# Patient Record
Sex: Female | Born: 1990 | Race: White | Hispanic: No | Smoking: Current every day smoker
Health system: Southern US, Community
[De-identification: ages and names within clinical notes are randomized; demographics above are authoritative.]

## PROBLEM LIST (undated history)

## (undated) DIAGNOSIS — F419 Anxiety disorder, unspecified: Secondary | ICD-10-CM

## (undated) DIAGNOSIS — F329 Major depressive disorder, single episode, unspecified: Secondary | ICD-10-CM

## (undated) DIAGNOSIS — F32A Depression, unspecified: Secondary | ICD-10-CM

## (undated) DIAGNOSIS — N879 Dysplasia of cervix uteri, unspecified: Secondary | ICD-10-CM

## (undated) DIAGNOSIS — F1910 Other psychoactive substance abuse, uncomplicated: Secondary | ICD-10-CM

## (undated) DIAGNOSIS — Z811 Family history of alcohol abuse and dependence: Secondary | ICD-10-CM

## (undated) DIAGNOSIS — Z8619 Personal history of other infectious and parasitic diseases: Secondary | ICD-10-CM

## (undated) DIAGNOSIS — Z975 Presence of (intrauterine) contraceptive device: Secondary | ICD-10-CM

## (undated) HISTORY — DX: Presence of (intrauterine) contraceptive device: Z97.5

## (undated) HISTORY — DX: Personal history of other infectious and parasitic diseases: Z86.19

## (undated) HISTORY — PX: BACK SURGERY: SHX140

## (undated) HISTORY — DX: Other psychoactive substance abuse, uncomplicated: F19.10

## (undated) HISTORY — DX: Dysplasia of cervix uteri, unspecified: N87.9

## (undated) HISTORY — DX: Family history of alcohol abuse and dependence: Z81.1

---
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
## 2008-02-17 ENCOUNTER — Inpatient Hospital Stay (HOSPITAL_COMMUNITY): Admission: EM | Admit: 2008-02-17 | Discharge: 2008-02-20 | Payer: Self-pay | Admitting: Psychiatry

## 2008-02-17 ENCOUNTER — Emergency Department (HOSPITAL_COMMUNITY): Admission: EM | Admit: 2008-02-17 | Discharge: 2008-02-17 | Payer: Self-pay | Admitting: Emergency Medicine

## 2008-02-18 ENCOUNTER — Ambulatory Visit: Payer: Self-pay | Admitting: Psychiatry

## 2008-08-10 ENCOUNTER — Ambulatory Visit: Payer: Self-pay | Admitting: *Deleted

## 2008-08-10 ENCOUNTER — Emergency Department (HOSPITAL_COMMUNITY): Admission: EM | Admit: 2008-08-10 | Discharge: 2008-08-11 | Payer: Self-pay | Admitting: Emergency Medicine

## 2008-08-11 ENCOUNTER — Inpatient Hospital Stay (HOSPITAL_COMMUNITY): Admission: RE | Admit: 2008-08-11 | Discharge: 2008-08-14 | Payer: Self-pay | Admitting: *Deleted

## 2008-08-15 ENCOUNTER — Emergency Department (HOSPITAL_COMMUNITY): Admission: EM | Admit: 2008-08-15 | Discharge: 2008-08-15 | Payer: Self-pay | Admitting: *Deleted

## 2008-08-16 ENCOUNTER — Inpatient Hospital Stay (HOSPITAL_COMMUNITY): Admission: AD | Admit: 2008-08-16 | Discharge: 2008-08-19 | Payer: Self-pay | Admitting: Psychiatry

## 2008-08-24 ENCOUNTER — Other Ambulatory Visit (HOSPITAL_COMMUNITY): Admission: RE | Admit: 2008-08-24 | Discharge: 2008-11-12 | Payer: Self-pay | Admitting: Psychiatry

## 2008-08-27 ENCOUNTER — Ambulatory Visit: Payer: Self-pay | Admitting: Psychiatry

## 2009-04-01 ENCOUNTER — Emergency Department (HOSPITAL_COMMUNITY): Admission: EM | Admit: 2009-04-01 | Discharge: 2009-04-01 | Payer: Self-pay | Admitting: Emergency Medicine

## 2010-02-05 ENCOUNTER — Emergency Department (HOSPITAL_COMMUNITY): Admission: EM | Admit: 2010-02-05 | Discharge: 2010-02-06 | Payer: Self-pay | Admitting: Emergency Medicine

## 2011-01-01 LAB — POCT I-STAT, CHEM 8
Creatinine, Ser: 0.8 mg/dL (ref 0.4–1.2)
Glucose, Bld: 92 mg/dL (ref 70–99)
HCT: 45 % (ref 36.0–46.0)
Hemoglobin: 15.3 g/dL — ABNORMAL HIGH (ref 12.0–15.0)
Sodium: 138 mEq/L (ref 135–145)

## 2011-01-01 LAB — POCT PREGNANCY, URINE: Preg Test, Ur: NEGATIVE

## 2011-01-01 LAB — GLUCOSE, CAPILLARY: Glucose-Capillary: 100 mg/dL — ABNORMAL HIGH (ref 70–99)

## 2011-02-07 NOTE — Discharge Summary (Signed)
[name], [name]            ACCOUNT NO.:  [id number]   MEDICAL RECORD NO.:  [id number]          PATIENT TYPE:  IPS   LOCATION:  0302                          FACILITY:  [facility]   PHYSICIAN:  Jasmine Pang, M.D. DATE OF BIRTH:  31-Jul-1991   DATE OF ADMISSION:  08/16/2008  DATE OF DISCHARGE:  08/19/2008                               DISCHARGE SUMMARY   IDENTIFICATION:  This is a 20 year old single white female who was  admitted on a voluntary basis on August 16, 2008.   HISTORY OF PRESENT ILLNESS:  The patient had just been discharged from  our unit within the past couple of days.  She states she got into an  argument with her mother into an overdose of her Ativan.  She was sent  to [facility] where she was medically stabilized.  She had  been begging her mother for money to get marijuana.  She states this is  what started the argument.   PSYCHIATRIC HISTORY:  As indicated above, she was diagnosed with bipolar  disorder NOS and started on Depakote ER 500 mg p.o. q.h.s.   ALCOHOL AND DRUG HISTORY:  Smokes THC.  She also smokes cigarettes.   MEDICAL PROBLEMS:  None.   MEDICATIONS:  Depakote ER 500 mg p.o. q.h.s.   PHYSICAL FINDINGS:  Physical exam was done in the [facility] ED.  There  were no acute physical or medical problems noted.   ADMISSION LABORATORIES:  WBC was 12.4.  Alcohol level less than 5.  Urinalysis, 3-6 WBCs.  Salicylate level less than 4.  UDS was positive  for benzodiazepines and THC.   HOSPITAL COURSE:  Upon admission, the patient was started on Depakote ER  500 mg p.o. q.h.s.  She was also started on Seroquel 25 mg p.o. q.4 h.  p.r.n. agitation.  She was also started on Librium 25 mg p.o. q.4 h.  p.r.n. withdrawal.  On August 17, 2008, Depakote ER was increased to  750 mg p.o. q.h.s.  In individual sessions with me, the patient was  somewhat irritable, but cooperative.  She stated I feel like my mom was  ripping me to shreds.  She wants a  family session with her mother.  Sleep has been poor.  She states she has been depressed and anxious.  Her affect was tearful.  She denied suicidal ideation now.  She states  she was trying to get a reaction from her mother.  She states that she  was jealous because her mother's boyfriend came to spend the weekend  with them on the same day that she could discharge.  She felt her mother  should have been the time just with her.  On August 18, 2008, she was  less depressed, less anxious.  Mother visited last night.  The patient  states she wants to stay clean.  She had a family session with her  mother.  The family session did not go well.  She was argumentative with  her mother, however, in the 29.  After the session, she states they were  able to get things settle and become  much more and come to some terms  with each other.  On April 18, 2008, sleep was good.  Appetite was good.  Mood was less depressed, less anxious.  Affect was consistent with mood.  There was no suicidal or homicidal ideation.  No thoughts of self-  injurious behavior.  No auditory or visual hallucinations.  No paranoia  or delusions.  Thoughts were logical and goal directed.  Thought  content, no predominant theme.  Cognitive was grossly intact.  Insight  fair.  Judgment fair.  Impulse control good.  The patient felt ready to  go home today.  Mother wanted her to come home since it was Thanksgiving  the following day.   DISCHARGE DIAGNOSES:  Axis I:  Bipolar disorder, not otherwise  specified.  Marijuana abuse.  Axis II:  Features of borderline personality disorder.  Axis III:  None.  Axis IV:  Severe (conflict with mother, burden of psychiatric illness).  Axis V:  Global assessment of functioning was 50 upon discharge.  GAF  was 35 upon admission.  GAF highest past year was 60.   DISCHARGE PLAN:  The patient will go to the [facility] Intensive  Outpatient CD program on November 30th at 10:30 a.m.   DISCHARGE  MEDICATIONS:  1. Depakote ER 750 mg p.o. q.h.s.  2. Seroquel 25 mg every 4 hours as needed for anxiety.      Jasmine Pang, M.D.  Electronically Signed     BHS/MEDQ  D:  08/20/2008  T:  08/20/2008  Job:  [id number]

## 2011-02-07 NOTE — Discharge Summary (Signed)
[name], [name]            ACCOUNT NO.:  [id number]   MEDICAL RECORD NO.:  [id number]          PATIENT TYPE:  IPS   LOCATION:  0305                          FACILITY:  [facility]   PHYSICIAN:  [name], M.D. DATE OF BIRTH:  Jan 30, 1991   DATE OF ADMISSION:  08/11/2008  DATE OF DISCHARGE:  08/14/2008                               DISCHARGE SUMMARY   IDENTIFYING INFORMATION:  This is a 20 year old single white female who  was admitted on a voluntary basis on August 10, 2008.   HISTORY OF PRESENT ILLNESS:  The patient presented with an intentional  overdose on her mother's alprazolam, cough syrup, 10 Advil, and 15  Effexor.  Boyfriend confirmed she left a voice mail indicating her  intent to kill herself.  She was brought to the ED when the family found  her suicide note.  She endorses irritability and angry mood.  She wished  that she had succeeded accusing up on world.  Still grieves the death  of her boyfriend in Feb 06, 2006.   PAST PSYCHIATRIC HISTORY:  The patient has an outpatient therapy.  She  has a history of alprazolam abuse.  This is the second [facility] admission for  the patient.  Previous admission was 02/07/2008.  She smokes marijuana 2  balls daily to calm down.  She had a history of suicidal gesture prior  to this admission (overdose).   FAMILY HISTORY:  Grandmother completed suicide via gunshot wound.  Father is an alcoholic according to the patient.   ALCOHOL AND DRUG HISTORY:  See the history of present illness.   MEDICAL PROBLEMS:  No acute or chronic medical problems.   MEDICATIONS:  Effexor XR 75 mg daily; she states she does not take it  regularly.  Ativan in the ED.   DRUG ALLERGIES:  SULFA.   PHYSICAL FINDINGS:  There were no acute physical or medical problems  noted.  Her physical exam was done in the ED prior to admission.   ADMISSION LABORATORIES:  UDS was positive for benzodiazepines and THC.  ECG, she was medically cleared.   HOSPITAL COURSE:  Upon  admission, the patient was placed on Librium 25  mg p.o. q.6 hours p.r.n. symptoms of withdrawal and Ambien 5 mg p.o.  q.h.s. p.r.n. insomnia.  On August 12, 2008, the Librium was  discontinued and she was placed on Ativan 1 mg q.6 hours p.r.n.  agitation and anxiety.  In individual sessions with me, the patient was  reserved, but cooperative.  She also participated appropriately in unit  therapeutic groups and activities.  She denies a specific stressor, but  just life.  She states she wanted to die.  She states she stopped  using cocaine after her last hospitalization with [place] in Feb 07, 2008.  She  does admit using marijuana daily and a lot of pills, Xanax, Klonopin,  and pain pills.  She was seeing Mariane Masters, but has not been there  for a while.  She states she feels safe in the hospital scared to go  home.  She was on Effexor, but it activated her.  She also talked  about  positive mood swings alternating between euphoria and depression.  On  August 12, 2008, she was tearful and anxious.  She talked about her  addiction to marijuana.  She felt she needed this to stabilize her mood.  She continued to state she wished she had succeeded I should not be  here.  She was started on Depakote ER 500 mg p.o. daily and she was  also started on Neurontin 100 mg p.o. q.4 hours p.r.n. anxiety.  On  August 13, 2008, she was less depressed, less anxious.  There was no  suicidal ideation.  She talked about having HPV and feels embarrassed  about this.  She states a former boyfriend found out and she had to talk  to him about it, she felt ashamed.  On August 14, 2008, sleep was  good, appetite was good.  Mood was less depressed, less anxious.  Affect  consistent with mood.  There was no suicidal or homicidal ideation.  No  thoughts of self injurious behavior.  No auditory or visual  hallucinations.  No paranoia or delusions.  Thoughts were logical and  goal-directed, thought content.  No  predominant theme.  Cognitive was  grossly intact.  A meeting was held with her mother, discussed the  patient's medications.  Mother was willing to take her home.  The  patient was referred to the [facility] Intensive  Outpatient Program and was seen by Darlyne Russian, director of this  program, who assessed the patient for the referral.   DISCHARGE DIAGNOSES:  Axis I:  Bipolar disorder not otherwise specified.  Axis II:  None.  Axis III:  Human papillomavirus.  AXIS IV:  Moderate (problems with primary support group, burden of  psychiatric illness).  Axis V:  Global assessment of functioning was 50 upon discharge.  GAF  was 25 upon admission.  GAF highest past year was 60-65.   DISCHARGE PLAN:  There was no specific activity level or dietary  restrictions.   POSTHOSPITAL CARE PLANS:  The patient will go to the CD IOP program on  August 17, 2008, at 10:30 a.m.   DISCHARGE MEDICATIONS:  1. Ativan 0.5 mg every 6 hours as needed, she was given just 24.  2. Depakote ER 500 mg p.o. q.6 p.m.      [name], M.D.  Electronically Signed     BHS/MEDQ  D:  09/13/2008  T:  09/13/2008  Job:  [id number]

## 2011-02-07 NOTE — Discharge Summary (Signed)
[name], [name]            ACCOUNT NO.:  [id number]   MEDICAL RECORD NO.:  [id number]          PATIENT TYPE:  IPS   LOCATION:  0307                          FACILITY:  [facility]   PHYSICIAN:  Jasmine Pang, M.D. DATE OF BIRTH:  03/22/1991   DATE OF ADMISSION:  02/17/2008  DATE OF DISCHARGE:  02/20/2008                               DISCHARGE SUMMARY   IDENTIFICATION:  This is an 20 year old single white female who was  admitted on a voluntary basis on Feb 17, 2008.   HISTORY OF PRESENT ILLNESS:  The patient states that she tried to kill  myself by taking pills.  She feels that she would be better off dead.  She states she is addicted to crack cocaine and pills.  She overdosed on  Tylenol taking approximately 30 tablets.  She also overdosed on 30-40  ibuprofen and 7 Vicodin, 3 Xanax and Nyquil tablets.  She reports  feeling very overwhelmed with her work relationship issues.  She called  her sister and told her about the overdose who then called her mother.  The patient was taken to the emergency department per EMS.  She was  feeling very worthless and hopeless.   PAST PSYCHIATRIC HISTORY:  This is the first admission to [facility].  She was  to start seeing Elsie Stain at [facility].   FAMILY HISTORY:  Grandmother committed suicide.  Father had problems  with alcohol.   ALCOHOL AND DRUG HISTORY:  As above.  Denies any IV drug use.  Again,  she reports being addicted to crack cocaine and pain killers.   MEDICAL PROBLEMS:  She denies any acute or chronic health issues.   MEDICATIONS:  None.   DRUG ALLERGIES:  SULFA.   PHYSICAL FINDINGS:  This is a healthy-appearing young female who was  fully assessed at the [facility] emergency department.  Her physical  exam was reviewed.  There were no acute physical or medical problems  noted.  She did receive a GI cocktail Pepcid, Zofran IV push, and  potassium chloride 40 mEq for a potassium of 2.9.   ADMISSION LABS:   Laboratory data showed an acetaminophen level of 69.3  down to 29.9.  Alcohol level was 62.  Salicylate was less than 4.  Urinalysis was negative.  Urine drug screen was positive for opiates and  THC.  CBC was within normal limits.   HOSPITAL COURSE:  Upon admission, the patient was started on Ambien 5 mg  p.o. q.h.s. p.r.n., insomnia.  She was also started on Effexor XR 37.5  mg daily to take in the a.m.  The patient tolerated these medications  well with no significant side effects.  In individual sessions with me,  the patient was lying in bed.  She was reserved, but cooperative.  She  stated she had wanted to kill herself.  She was somewhat resistant  initially to attending unit group therapies and activities, but became  more involved as hospitalization progressed.  Stressors revolve around  her drug use.  She works in school.  She lives with her mother who she  describes is very  supportive.  On Feb 19, 2008, the patient was still  somewhat depressed and irritable.  She discussed her drug using friends  and conflicted feelings about whether to give these people up.  She  discussed the death of her boyfriend from osteosarcoma.  Her mother has  been helped by Effexor and that is the reason this medication was chosen  for the patient.  On Feb 20, 2008, mental status had improved markedly  from admission status.  Mood was less depressed, less anxious.  Affect  was consistent with mood.  There was no suicidal or homicidal ideation.  No thoughts of self-injurious behavior.  No auditory or visual  hallucinations.  No paranoia or delusions.  Thoughts were logical and  goal-directed.  Thought content, no predominant theme.  Cognitive was  grossly intact.  It was felt the patient was stable for discharged  today.  She was going to go home with her mother.  Followup was  scheduled with her therapist Mariane Masters and psychiatrist at [facility].  She is also to go to the [facility]  intensive outpatient  group.   DISCHARGE DIAGNOSES:  Axis I:  Depressive disorder, not otherwise  specified.  Polysubstance abuse.  Axis II:  None.  Axis III:  No known medical problems.  Axis IV:  Moderate (other psychosocial problems, problems with  occupation, and problems with education).  Axis V:  Global assessment of functioning was 50 upon discharge.  GAF  was 35-40 upon admission.  GAF highest past year was 65-70.   DISCHARGE PLANS:  There was no specific activity level or dietary  restriction.   POSTHOSPITAL CARE PLANS:  The patient will see Mariane Masters for  outpatient therapy.  This appointment has already been arranged by her  mother for Wednesday February 26, 2008.  She will also go to the [facility] intensive outpatient groups on June 1 at 9 o'clock.  She will get  a psychiatrist at the [facility].   DISCHARGE MEDICATIONS:  1. Effexor XR 37.5 mg daily in a.m.  2. Ambien 5 mg at bedtime p.r.n., insomnia.      Jasmine Pang, M.D.  Electronically Signed     BHS/MEDQ  D:  02/20/2008  T:  02/20/2008  Job:  [id number]

## 2011-02-07 NOTE — H&P (Signed)
[name], [name]            ACCOUNT NO.:  [id number]   MEDICAL RECORD NO.:  [id number]          PATIENT TYPE:  IPS   LOCATION:  0307                          FACILITY:  BH   PHYSICIAN:  Jasmine Pang, M.D. DATE OF BIRTH:  Jan 24, 1991   DATE OF ADMISSION:  02/17/2008  DATE OF DISCHARGE:  02/17/2008                       PSYCHIATRIC ADMISSION ASSESSMENT   This is an 20 year old female, voluntarily admitted on Feb 17, 2008.   HISTORY OF PRESENT ILLNESS:  The patient states that she tried to kill  herself by taking pills.  She feels that she would be better off  dead.  She states she is addicted to crack cocaine and pain pills.  She  overdosed on Tylenol, taking approximately 30 tablets, also overdosed on  30 to 40 ibuprofen and 7 Vicodin, 3 Xanax and NyQuil tablets.  She  reported feeling very overwhelmed with her work, relationship issues,  then called her sister and told her of the overdose, who then called her  mother, and the patient was taken to the emergency department per EMS.  She was feeling very worthless and hopeless.   PAST PSYCHIATRIC HISTORY:  First admission to [facility].  Was to start seeing Elsie Stain at [facility].   SOCIAL HISTORY:  An 20 year old female.  She lives with her mother.  She  has been doing online courses for her diploma.  She works at a [employer].   FAMILY HISTORY:  Grandmother committed suicide, father problems with  alcohol.   ALCOHOL AND DRUG HISTORY:  As above.  Denies any IV drug use.  Again has  been she reports addicted to crack cocaine and pain killers.   PRIMARY CARE Kole Hilyard:  Unknown.   MEDICAL PROBLEMS:  Denies any acute or chronic health issues.   MEDICATIONS:  None.   DRUG ALLERGIES:  SULFA.   PHYSICAL EXAMINATION:  This is a healthy-appearing young female, fully  assessed at [facility] emergency department.  Her physical exam was  reviewed.  Poison Control was notified.  The  patient did receive a GI  cocktail,  Pepcid, Zofran IV push and potassium chloride 40 mEq for a  potassium of 2.9.  Temperature was 97.3, 65 heart rate, her blood  pressure was 117/72, 5 feet, 4 inches tall, 120 pounds.   Laboratory data showed an acetaminophen level of 69.3, down to 29.9.  Alcohol level was 62.  Salicylate level less than 4.  Urinalysis was  negative.  Urine drug screen was positive for opiates, positive for THC.  CBC within normal limits   MENTAL STATUS EXAM:  This is a sleepy female, resting in bed but easily  arousable and participates in the interview.  She has fair eye contact.  Her speech is clear, normal pace and tone.  The patient's mood is  worthless.  Her affect is flat.  She appears sad.  Thought processes are  coherent.  No evidence of any delusional thinking.  Cognitive function  intact.  Judgment and insight are fair.   AXIS I:  Major depressive disorder.  Polysubstance abuse.  AXIS II:  Deferred.  AXIS III:  No known medical problems.  AXIS IV:  Other psychosocial problems and problems with occupation, also  problems with education.  AXIS V:  Current is 35 to 40.   PLAN:  Contract for safety.  We will stabilize mood and thinking.  The  patient will be going to the red team for therapy.  We will have a  family session with her mother.  We will work on relapse, and case  management will obtain her followups with Elsie Stain.  We will  continue to assess symptoms.  The patient may benefit from an  antidepressant.  Her tentative length of stay at this time is 3 to 5  days.      Landry Corporal, N.P.      Jasmine Pang, M.D.  Electronically Signed    JO/MEDQ  D:  02/19/2008  T:  02/19/2008  Job:  [id number]

## 2011-05-02 ENCOUNTER — Ambulatory Visit (HOSPITAL_COMMUNITY): Payer: Self-pay | Admitting: Physician Assistant

## 2011-06-21 LAB — COMPREHENSIVE METABOLIC PANEL
AST: 21
Alkaline Phosphatase: 91
CO2: 24
Calcium: 8.6
Chloride: 110
Total Protein: 6.3

## 2011-06-21 LAB — RAPID URINE DRUG SCREEN, HOSP PERFORMED
Amphetamines: NOT DETECTED
Benzodiazepines: NOT DETECTED
Opiates: POSITIVE — AB

## 2011-06-21 LAB — DIFFERENTIAL
Basophils Absolute: 0.1
Basophils Relative: 1
Eosinophils Absolute: 0.1
Eosinophils Relative: 2
Lymphocytes Relative: 60 — ABNORMAL HIGH
Monocytes Relative: 8
Neutro Abs: 1.8
Neutrophils Relative %: 29 — ABNORMAL LOW

## 2011-06-21 LAB — URINALYSIS, ROUTINE W REFLEX MICROSCOPIC
Hgb urine dipstick: NEGATIVE
Ketones, ur: NEGATIVE
Specific Gravity, Urine: 1.011
Urobilinogen, UA: 0.2
pH: 7

## 2011-06-21 LAB — PROTIME-INR
INR: 1
Prothrombin Time: 13.6

## 2011-06-21 LAB — ETHANOL: Alcohol, Ethyl (B): 62 — ABNORMAL HIGH

## 2011-06-21 LAB — CBC
Platelets: 190
RBC: 4.6
WBC: 6.2

## 2011-06-21 LAB — ACETAMINOPHEN LEVEL: Acetaminophen (Tylenol), Serum: 67.9 — ABNORMAL HIGH

## 2011-06-27 LAB — URINE MICROSCOPIC-ADD ON

## 2011-06-27 LAB — URINALYSIS, ROUTINE W REFLEX MICROSCOPIC
Bilirubin Urine: NEGATIVE
Hgb urine dipstick: NEGATIVE
Nitrite: NEGATIVE
Specific Gravity, Urine: 1.014
Specific Gravity, Urine: 1.038 — ABNORMAL HIGH
Urobilinogen, UA: 0.2
pH: 6

## 2011-06-27 LAB — DIFFERENTIAL
Basophils Relative: 1
Eosinophils Absolute: 0
Lymphocytes Relative: 25
Lymphs Abs: 2.3
Lymphs Abs: 3.9
Monocytes Relative: 7
Neutro Abs: 7.4
Neutrophils Relative %: 60

## 2011-06-27 LAB — RAPID URINE DRUG SCREEN, HOSP PERFORMED
Amphetamines: NOT DETECTED
Barbiturates: NOT DETECTED
Benzodiazepines: POSITIVE — AB
Benzodiazepines: POSITIVE — AB
Tetrahydrocannabinol: POSITIVE — AB

## 2011-06-27 LAB — T4, FREE: Free T4: 1.18

## 2011-06-27 LAB — POCT I-STAT, CHEM 8
Chloride: 105
Glucose, Bld: 97
HCT: 46
Hemoglobin: 15.6 — ABNORMAL HIGH
Potassium: 3.6
Sodium: 142

## 2011-06-27 LAB — CBC
MCHC: 34.6
MCV: 92.8
Platelets: 267
Platelets: 287
RBC: 4.77
RBC: 4.91
RDW: 12.9
WBC: 12.4 — ABNORMAL HIGH

## 2011-06-27 LAB — BASIC METABOLIC PANEL
BUN: 9
Calcium: 10.1
Creatinine, Ser: 0.6
GFR calc Af Amer: >60
Glucose, Bld: 88
Sodium: 142

## 2011-06-27 LAB — HEPATIC FUNCTION PANEL
Albumin: 3.9
Alkaline Phosphatase: 100
Bilirubin, Direct: 0.1
Indirect Bilirubin: 0.5
Total Bilirubin: 0.6

## 2011-06-27 LAB — SALICYLATE LEVEL
Salicylate Lvl: <4
Salicylate Lvl: <4

## 2011-06-27 LAB — POCT PREGNANCY, URINE: Preg Test, Ur: NEGATIVE

## 2011-06-27 LAB — VALPROIC ACID LEVEL: Valproic Acid Lvl: 43.5 — ABNORMAL LOW

## 2011-06-27 LAB — ETHANOL: Alcohol, Ethyl (B): <5

## 2011-06-27 LAB — RPR: RPR Ser Ql: NONREACTIVE

## 2011-06-30 LAB — URINE DRUGS OF ABUSE SCREEN W ALC, ROUTINE (REF LAB)
Amphetamine Screen, Ur: NEGATIVE
Amphetamine Screen, Ur: NEGATIVE
Benzodiazepines.: NEGATIVE
Benzodiazepines.: NEGATIVE
Benzodiazepines.: NEGATIVE
Cocaine Metabolites: NEGATIVE
Cocaine Metabolites: NEGATIVE
Creatinine,U: 133.7 mg/dL
Ethyl Alcohol: <10 mg/dL (ref <10)
Ethyl Alcohol: <10 mg/dL (ref <10)
Marijuana Metabolite: POSITIVE — AB
Methadone: NEGATIVE
Methadone: NEGATIVE
Opiate Screen, Urine: NEGATIVE
Opiate Screen, Urine: NEGATIVE
Phencyclidine (PCP): NEGATIVE
Phencyclidine (PCP): NEGATIVE
Phencyclidine (PCP): NEGATIVE
Propoxyphene: NEGATIVE
Propoxyphene: NEGATIVE

## 2011-06-30 LAB — THC (MARIJUANA), URINE, CONFIRMATION: Marijuana, Ur-Confirmation: 99 ng/mL

## 2012-01-04 ENCOUNTER — Other Ambulatory Visit: Payer: Self-pay

## 2012-01-04 ENCOUNTER — Other Ambulatory Visit (HOSPITAL_COMMUNITY): Payer: Self-pay | Admitting: Radiology

## 2012-01-04 ENCOUNTER — Ambulatory Visit (HOSPITAL_COMMUNITY): Payer: PRIVATE HEALTH INSURANCE | Attending: Cardiology

## 2012-01-04 DIAGNOSIS — I452 Bifascicular block: Secondary | ICD-10-CM

## 2012-01-04 DIAGNOSIS — R0609 Other forms of dyspnea: Secondary | ICD-10-CM | POA: Insufficient documentation

## 2012-01-04 DIAGNOSIS — R0600 Dyspnea, unspecified: Secondary | ICD-10-CM

## 2012-01-04 DIAGNOSIS — I495 Sick sinus syndrome: Secondary | ICD-10-CM

## 2012-01-04 DIAGNOSIS — R0989 Other specified symptoms and signs involving the circulatory and respiratory systems: Secondary | ICD-10-CM

## 2012-01-05 ENCOUNTER — Encounter (HOSPITAL_COMMUNITY): Payer: Self-pay | Admitting: Internal Medicine

## 2013-10-30 ENCOUNTER — Other Ambulatory Visit (HOSPITAL_COMMUNITY)
Admission: RE | Admit: 2013-10-30 | Discharge: 2013-10-30 | Disposition: A | Discharge status: Home / Self Care | Payer: Managed Care, Other (non HMO) | Source: Ambulatory Visit | Attending: Family Medicine | Admitting: Family Medicine

## 2013-10-30 ENCOUNTER — Other Ambulatory Visit: Payer: Self-pay | Admitting: Family Medicine

## 2013-10-30 DIAGNOSIS — Z124 Encounter for screening for malignant neoplasm of cervix: Secondary | ICD-10-CM | POA: Insufficient documentation

## 2014-12-03 ENCOUNTER — Telehealth: Payer: Self-pay | Admitting: Family Medicine

## 2015-04-28 NOTE — Telephone Encounter (Signed)
finished

## 2015-09-24 ENCOUNTER — Emergency Department (HOSPITAL_COMMUNITY)
Admission: EM | Admit: 2015-09-24 | Discharge: 2015-09-24 | Disposition: A | Discharge status: Home / Self Care | Payer: Managed Care, Other (non HMO) | Attending: Emergency Medicine | Admitting: Emergency Medicine

## 2015-09-24 ENCOUNTER — Encounter (HOSPITAL_COMMUNITY): Payer: Self-pay | Admitting: Oncology

## 2015-09-24 DIAGNOSIS — Z8659 Personal history of other mental and behavioral disorders: Secondary | ICD-10-CM | POA: Diagnosis not present

## 2015-09-24 DIAGNOSIS — S0990XA Unspecified injury of head, initial encounter: Secondary | ICD-10-CM | POA: Diagnosis present

## 2015-09-24 DIAGNOSIS — Y998 Other external cause status: Secondary | ICD-10-CM | POA: Diagnosis not present

## 2015-09-24 DIAGNOSIS — W108XXA Fall (on) (from) other stairs and steps, initial encounter: Secondary | ICD-10-CM | POA: Diagnosis not present

## 2015-09-24 DIAGNOSIS — Y9389 Activity, other specified: Secondary | ICD-10-CM | POA: Insufficient documentation

## 2015-09-24 DIAGNOSIS — F17200 Nicotine dependence, unspecified, uncomplicated: Secondary | ICD-10-CM | POA: Diagnosis not present

## 2015-09-24 DIAGNOSIS — Y9289 Other specified places as the place of occurrence of the external cause: Secondary | ICD-10-CM | POA: Insufficient documentation

## 2015-09-24 HISTORY — DX: Anxiety disorder, unspecified: F41.9

## 2015-09-24 HISTORY — DX: Major depressive disorder, single episode, unspecified: F32.9

## 2015-09-24 HISTORY — DX: Depression, unspecified: F32.A

## 2015-09-24 MED ORDER — NAPROXEN 500 MG PO TABS: 500.0000 mg | ORAL_TABLET | Freq: Two times a day (BID) | ORAL | Status: DC

## 2015-09-24 NOTE — Discharge Instructions (Signed)
It is possible that you may have a mild concussion. Mild concussions do not show up on CT scan. A CT scan would be performed if there was concern for skull fracture or bleeding in your brain. You had a normal neurologic and physical exam today which is the reason for foregoing in this test. It is normal that you may have a mild headache for the next few days following a head injury. We recommend ibuprofen or naproxen for management of any headache. Refrain from strenuous activity or heavy lifting. Do not participate in contact sports for one week. Return to the emergency department if symptoms worsen such as if you experience memory loss, loss of consciousness, weakness or numbness on one side of your body, or worsening of your headache with associated nausea or vomiting.  Concussion, Adult A concussion, or closed-head injury, is a brain injury caused by a direct blow to the head or by a quick and sudden movement (jolt) of the head or neck. Concussions are usually not life-threatening. Even so, the effects of a concussion can be serious. If you have had a concussion before, you are more likely to experience concussion-like symptoms after a direct blow to the head.  CAUSES  Direct blow to the head, such as from running into another player during a soccer game, being hit in a fight, or hitting your head on a hard surface.  A jolt of the head or neck that causes the brain to move back and forth inside the skull, such as in a car crash. SIGNS AND SYMPTOMS The signs of a concussion can be hard to notice. Early on, they may be missed by you, family members, and health care providers. You may look fine but act or feel differently. Symptoms are usually temporary, but they may last for days, weeks, or even longer. Some symptoms may appear right away while others may not show up for hours or days. Every head injury is different. Symptoms include:  Mild to moderate headaches that will not go away.  A feeling of  pressure inside your head.  Having more trouble than usual:  Learning or remembering things you have heard.  Answering questions.  Paying attention or concentrating.  Organizing daily tasks.  Making decisions and solving problems.  Slowness in thinking, acting or reacting, speaking, or reading.  Getting lost or being easily confused.  Feeling tired all the time or lacking energy (fatigued).  Feeling drowsy.  Sleep disturbances.  Sleeping more than usual.  Sleeping less than usual.  Trouble falling asleep.  Trouble sleeping (insomnia).  Loss of balance or feeling lightheaded or dizzy.  Nausea or vomiting.  Numbness or tingling.  Increased sensitivity to:  Sounds.  Lights.  Distractions.  Vision problems or eyes that tire easily.  Diminished sense of taste or smell.  Ringing in the ears.  Mood changes such as feeling sad or anxious.  Becoming easily irritated or angry for little or no reason.  Lack of motivation.  Seeing or hearing things other people do not see or hear (hallucinations). DIAGNOSIS Your health care provider can usually diagnose a concussion based on a description of your injury and symptoms. He or she will ask whether you passed out (lost consciousness) and whether you are having trouble remembering events that happened right before and during your injury. Your evaluation might include:  A brain scan to look for signs of injury to the brain. Even if the test shows no injury, you may still have a concussion.  Blood tests to be sure other problems are not present. TREATMENT  Concussions are usually treated in an emergency department, in urgent care, or at a clinic. You may need to stay in the hospital overnight for further treatment.  Tell your health care provider if you are taking any medicines, including prescription medicines, over-the-counter medicines, and natural remedies. Some medicines, such as blood thinners (anticoagulants)  and aspirin, may increase the chance of complications. Also tell your health care provider whether you have had alcohol or are taking illegal drugs. This information may affect treatment.  Your health care provider will send you home with important instructions to follow.  How fast you will recover from a concussion depends on many factors. These factors include how severe your concussion is, what part of your brain was injured, your age, and how healthy you were before the concussion.  Most people with mild injuries recover fully. Recovery can take time. In general, recovery is slower in older persons. Also, persons who have had a concussion in the past or have other medical problems may find that it takes longer to recover from their current injury. HOME CARE INSTRUCTIONS General Instructions  Carefully follow the directions your health care provider gave you.  Only take over-the-counter or prescription medicines for pain, discomfort, or fever as directed by your health care provider.  Take only those medicines that your health care provider has approved.  Do not drink alcohol until your health care provider says you are well enough to do so. Alcohol and certain other drugs may slow your recovery and can put you at risk of further injury.  If it is harder than usual to remember things, write them down.  If you are easily distracted, try to do one thing at a time. For example, do not try to watch TV while fixing dinner.  Talk with family members or close friends when making important decisions.  Keep all follow-up appointments. Repeated evaluation of your symptoms is recommended for your recovery.  Watch your symptoms and tell others to do the same. Complications sometimes occur after a concussion. Older adults with a brain injury may have a higher risk of serious complications, such as a blood clot on the brain.  Tell your teachers, school nurse, school counselor, coach, athletic  trainer, or work Production designer, theatre/television/film about your injury, symptoms, and restrictions. Tell them about what you can or cannot do. They should watch for:  Increased problems with attention or concentration.  Increased difficulty remembering or learning new information.  Increased time needed to complete tasks or assignments.  Increased irritability or decreased ability to cope with stress.  Increased symptoms.  Rest. Rest helps the brain to heal. Make sure you:  Get plenty of sleep at night. Avoid staying up late at night.  Keep the same bedtime hours on weekends and weekdays.  Rest during the day. Take daytime naps or rest breaks when you feel tired.  Limit activities that require a lot of thought or concentration. These include:  Doing homework or job-related work.  Watching TV.  Working on the computer.  Avoid any situation where there is potential for another head injury (football, hockey, soccer, basketball, martial arts, downhill snow sports and horseback riding). Your condition will get worse every time you experience a concussion. You should avoid these activities until you are evaluated by the appropriate follow-up health care providers. Returning To Your Regular Activities You will need to return to your normal activities slowly, not all at once. You  must give your body and brain enough time for recovery.  Do not return to sports or other athletic activities until your health care provider tells you it is safe to do so.  Ask your health care provider when you can drive, ride a bicycle, or operate heavy machinery. Your ability to react may be slower after a brain injury. Never do these activities if you are dizzy.  Ask your health care provider about when you can return to work or school. Preventing Another Concussion It is very important to avoid another brain injury, especially before you have recovered. In rare cases, another injury can lead to permanent brain damage, brain  swelling, or death. The risk of this is greatest during the first 7-10 days after a head injury. Avoid injuries by:  Wearing a seat belt when riding in a car.  Drinking alcohol only in moderation.  Wearing a helmet when biking, skiing, skateboarding, skating, or doing similar activities.  Avoiding activities that could lead to a second concussion, such as contact or recreational sports, until your health care provider says it is okay.  Taking safety measures in your home.  Remove clutter and tripping hazards from floors and stairways.  Use grab bars in bathrooms and handrails by stairs.  Place non-slip mats on floors and in bathtubs.  Improve lighting in dim areas. SEEK MEDICAL CARE IF:  You have increased problems paying attention or concentrating.  You have increased difficulty remembering or learning new information.  You need more time to complete tasks or assignments than before.  You have increased irritability or decreased ability to cope with stress.  You have more symptoms than before. Seek medical care if you have any of the following symptoms for more than 2 weeks after your injury:  Lasting (chronic) headaches.  Dizziness or balance problems.  Nausea.  Vision problems.  Increased sensitivity to noise or light.  Depression or mood swings.  Anxiety or irritability.  Memory problems.  Difficulty concentrating or paying attention.  Sleep problems.  Feeling tired all the time. SEEK IMMEDIATE MEDICAL CARE IF:  You have severe or worsening headaches. These may be a sign of a blood clot in the brain.  You have weakness (even if only in one hand, leg, or part of the face).  You have numbness.  You have decreased coordination.  You vomit repeatedly.  You have increased sleepiness.  One pupil is larger than the other.  You have convulsions.  You have slurred speech.  You have increased confusion. This may be a sign of a blood clot in the  brain.  You have increased restlessness, agitation, or irritability.  You are unable to recognize people or places.  You have neck pain.  It is difficult to wake you up.  You have unusual behavior changes.  You lose consciousness. MAKE SURE YOU:  Understand these instructions.  Will watch your condition.  Will get help right away if you are not doing well or get worse.   This information is not intended to replace advice given to you by your health care provider. Make sure you discuss any questions you have with your health care provider.   Document Released: 12/02/2003 Document Revised: 10/02/2014 Document Reviewed: 04/03/2013 Elsevier Interactive Patient Education [employer]

## 2015-09-24 NOTE — ED Provider Notes (Signed)
CSN: [id number]     Arrival date & time 09/24/15  [id number] History  By signing my name below, I, [name], attest that this documentation has been prepared under the direction and in the presence of TRW AutomotiveKelly Perlita Forbush, PA-C.  Electronically Signed: Tanda RockersMargaux [name], ED Scribe. 09/24/2015. 8:33 PM.   Chief Complaint  Patient presents with  . Head Injury   The history is provided by the patient. No language interpreter was used.     HPI Comments: [name] ButterChristina [name] is a 24 y.o. female who presents to the Emergency Department complaining of gradual onset, constant, moderate, throbbing, left temporal headache s/p head injury x 1 day, gradually worsening. Pt mentions that she was drinking EtOH last night and fell down carpeted stairs, hitting her head. She cannot remember the incident due to drinking and is unsure of LOC. She mentions that her friends told her that she seemed to be "knocked out" after the incident. She is also unsure of how many stairs she fell down but assumes it was the whole flight of stairs. Pt felt nauseous earlier today but has not vomited. Denies blurry vision, loss of vision, tinnitus, hearing loss, weakness, numbness, tingling, or any other associated symptoms. No hx previous head injuries.    Past Medical History  Diagnosis Date  . Anxiety   . Depression    Past Surgical History  Procedure Laterality Date  . Back surgery     History reviewed. No pertinent family history. Social History  Substance Use Topics  . Smoking status: Current Every Day Smoker  . Smokeless tobacco: Never Used  . Alcohol Use: Yes     Comment: socially   OB History    No data available      Review of Systems  HENT: Negative for hearing loss.   Eyes: Negative for visual disturbance.  Gastrointestinal: Positive for nausea. Negative for vomiting.  Neurological: Positive for headaches. Negative for weakness and numbness.  All other systems reviewed and are negative.   Allergies  Sulfa  antibiotics  Home Medications   Prior to Admission medications   Medication Sig Start Date End Date Taking? Authorizing Provider  naproxen (NAPROSYN) 500 MG tablet Take 1 tablet (500 mg total) by mouth 2 (two) times daily. 09/24/15   Antony MaduraKelly Alexandria Current, PA-C   Triage Vitals: BP 141/82 mmHg  Pulse 86  Temp(Src) 98.3 F (36.8 C) (Oral)  Resp 16  Ht 5\' 5"  (1.651 m)  Wt 170 lb (77.111 kg)  BMI 28.29 kg/m2  SpO2 100%   Physical Exam  Constitutional: She is oriented to person, place, and time. She appears well-developed and well-nourished. No distress.  Nontoxic/nonseptic appearing.  HENT:  Head: Normocephalic and atraumatic.  Mouth/Throat: Oropharynx is clear and moist. No oropharyngeal exudate.  No Battle sign or raccoons eyes. No skull instability. No hematoma. There is a mild contusion noted to the left mid parietal scalp.  Eyes: Conjunctivae and EOM are normal. No scleral icterus.  Neck: Normal range of motion.  No nuchal rigidity or meningismus  Cardiovascular: Normal rate, regular rhythm and intact distal pulses.   Pulmonary/Chest: Effort normal. No respiratory distress.  Respirations even and unlabored  Musculoskeletal: Normal range of motion.  Neurological: She is alert and oriented to person, place, and time. No cranial nerve deficit. She exhibits normal muscle tone. Coordination normal.  GCS 15. Speech is goal oriented. No cranial nerve deficits appreciated; symmetric eyebrow raise, no facial drooping, tongue midline. Patient has equal grip strength bilaterally with 5/5 strength against resistance  in all major muscle groups bilaterally. No pronator drift. Sensation to light touch intact in all extremities. Patient ambulatory with steady gait.  Skin: Skin is warm and dry. No rash noted. She is not diaphoretic. No erythema. No pallor.  Psychiatric: She has a normal mood and affect. Her behavior is normal.  Nursing note and vitals reviewed.    ED Course  Procedures (including  critical care time)  DIAGNOSTIC STUDIES: Oxygen Saturation is 100% on RA, normal by my interpretation.    COORDINATION OF CARE: 8:31 PM - Discussed treatment plan which includes OTC pain medication with pt at bedside and pt agreed to plan. Patient states "I feel fine. I think I psyched myself out".  Labs Review Labs Reviewed - No data to display  Imaging Review No results found.   EKG Interpretation None      MDM   Final diagnoses:  Head injury, initial encounter    24 year old female who since to the emergency department for evaluation of a head injury which occurred last night. Patient unsure about loss of consciousness, but she has a reassuring neurologic exam today. She has had a slight residual headache which appears appropriate given her injury. She reports some mild nausea, but has had no vomiting. No battle's sign or raccoon's eyes. No skull instability to suggest fracture. Given low suspicion for emergent intracranial process, I do not believe CT scan is indicated. Have discussed the use of NSAIDs for headache with return if symptoms worsen. Return precautions given at discharge. Patient discharged in satisfactory condition with no unaddressed concerns.  I personally performed the services described in this documentation, which was scribed in my presence. The recorded information has been reviewed and is accurate.    Filed Vitals:   09/24/15 1922 09/24/15 2100  BP: 141/82 130/83  Pulse: 86 57  Temp: 98.3 F (36.8 C) 98.5 F (36.9 C)  TempSrc: Oral Oral  Resp: 16 18  Height:  (1.651 m)   Weight: 77.111 kg   SpO2: 100% 96%        Antony Madura, PA-C 09/24/15 2130  Richardean Canal, MD 09/24/15 [phone]

## 2015-09-24 NOTE — ED Notes (Signed)
Per pt she was drinking w/ friends and fell and hit her head.  LOC?  Pt is A&O x 4 in triage.  Rates pain 5/10 to the left side of her head.  Denies double/blurred vision.  +nausea, denies vomiting.

## 2015-12-14 ENCOUNTER — Encounter: Payer: Self-pay | Admitting: Osteopathic Medicine

## 2015-12-14 ENCOUNTER — Ambulatory Visit (INDEPENDENT_AMBULATORY_CARE_PROVIDER_SITE_OTHER): Payer: Managed Care, Other (non HMO) | Admitting: Osteopathic Medicine

## 2015-12-14 VITALS — BP 110/70 | HR 88 | Ht 64.0 in | Wt 180.0 lb

## 2015-12-14 DIAGNOSIS — Z975 Presence of (intrauterine) contraceptive device: Secondary | ICD-10-CM

## 2015-12-14 DIAGNOSIS — Z811 Family history of alcohol abuse and dependence: Secondary | ICD-10-CM

## 2015-12-14 DIAGNOSIS — Z8619 Personal history of other infectious and parasitic diseases: Secondary | ICD-10-CM

## 2015-12-14 DIAGNOSIS — F411 Generalized anxiety disorder: Secondary | ICD-10-CM

## 2015-12-14 HISTORY — DX: Family history of alcohol abuse and dependence: Z81.1

## 2015-12-14 HISTORY — DX: Personal history of other infectious and parasitic diseases: Z86.19

## 2015-12-14 HISTORY — DX: Presence of (intrauterine) contraceptive device: Z97.5

## 2015-12-14 MED ORDER — SERTRALINE HCL 50 MG PO TABS: 50.0000 mg | ORAL_TABLET | Freq: Every day | ORAL | Status: DC

## 2015-12-14 MED ORDER — BUSPIRONE HCL 10 MG PO TABS: 5.0000 mg | ORAL_TABLET | Freq: Three times a day (TID) | ORAL | Status: DC | PRN

## 2015-12-14 MED ORDER — VALACYCLOVIR HCL 500 MG PO TABS: 500.0000 mg | ORAL_TABLET | Freq: Every day | ORAL | Status: DC

## 2015-12-14 NOTE — Progress Notes (Signed)
HPI: [name] is a 25 y.o. female who presents to [facility] today for chief complaint of:  Chief Complaint  Patient presents with  . Establish Care    request annual physical, renew Rx, meds for cold sores, OBGYN     PSYCH: History of anxiety, recent promotion at work has her very stressed out, occasional agoraphobia/social anxiety. Has previously been on Effexor, didn't tolerate that, and now on Sertraline and doing overall ok on this. 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 Like referral to counselor.   INFECTIOUS DISEASE: History of cold sores, becoming more frequent with outbreaks maybe every few weeks since she has experienced more stress at work, never been on abortive or suppressive therapy in the past but has had several friends with success on Valtrex  GYN: History of abnormal Pap, colposcopy/biopsy, no results available. Last Pap 10/2013 was NILM. HAS progesterone arm implant in place. Same partner many years, previously tested for HIV, does not desire further testing today for STI   Past medical, social and family history reviewed: Past Medical History  Diagnosis Date  . Anxiety   . Depression   . Family history of alcohol abuse 12/14/2015  . 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 Implanon in place 12/14/2015    06/2015     Past Surgical History  Procedure Laterality Date  . Back surgery     Social History  Substance Use Topics  . Smoking status: Current Every Day Smoker  . Smokeless tobacco: Never Used  . Alcohol Use: Yes     Comment: socially   Family History  Problem Relation Age of Onset  . Depression Mother   . Alcohol abuse Father   . Hypertension Maternal Uncle   . Depression Maternal Grandmother   . Cancer Maternal Grandfather   . Cancer Paternal Grandmother     Current Outpatient Prescriptions  Medication Sig Dispense Refill  . sertraline (ZOLOFT) 50 MG tablet Take 50 mg by mouth daily.    .  naproxen (NAPROSYN) 500 MG tablet Take 1 tablet (500 mg total) by mouth 2 (two) times daily. 30 tablet 0   No current facility-administered medications for this visit.   Allergies  Allergen Reactions  . Sulfa Antibiotics Hives      Review of Systems: CONSTITUTIONAL:  No  fever, no chills, No  unintentional weight changes HEAD/EYES/EARS/NOSE/THROAT: No  headache, no vision change, no hearing change, No  sore throat, No  sinus pressure CARDIAC: No  chest pain, No  pressure, No palpitations, No  orthopnea RESPIRATORY: No  cough, No  shortness of breath/wheeze GASTROINTESTINAL: No  nausea, No  vomiting, No  abdominal pain, No  blood in stool, No  diarrhea, No  constipation  MUSCULOSKELETAL: No  myalgia/arthralgia GENITOURINARY: No  incontinence, No  abnormal genital bleeding/discharge SKIN: No  rash/wounds/concerning lesions HEM/ONC: No  easy bruising/bleeding, No  abnormal lymph node ENDOCRINE: No polyuria/polydipsia/polyphagia, No  heat/cold intolerance  NEUROLOGIC: No  weakness, No  dizziness, No  slurred speech PSYCHIATRIC: No  concerns with depression, (+) concerns with anxiety, No sleep problems  Exam:  BP 110/70 mmHg  Pulse 88  Ht  (1.626 m)  Wt 180 lb (81.647 kg)  BMI 30.88 kg/m2 Constitutional: VS see above. General Appearance: alert, well-developed, well-nourished, NAD Eyes: Normal lids and conjunctive, non-icteric sclera, PERRLA Ears, Nose, Mouth, Throat: MMM, Normal external inspection ears/nares/mouth/lips/gums, .  Neck: No masses, trachea midline. No thyroid enlargement/tenderness/mass appreciated. No lymphadenopathy Respiratory: Normal  respiratory effort. no wheeze, no rhonchi, no rales Cardiovascular: S1/S2 normal, no murmur, no rub/gallop auscultated. RRR. No lower extremity edema. Musculoskeletal: Gait normal. No clubbing/cyanosis of digits.   Neurological: No cranial nerve deficit on limited exam. Motor and sensation intact and symmetric Skin: warm, dry,  intact. No rash/ulcer. No concerning nevi or subq nodules on limited exam.   Psychiatric: Normal judgment/insight. Normal mood and affect. Oriented x3.    No results found for this or any previous visit (from the past 72 hour(s)).    ASSESSMENT/PLAN:   Generalized anxiety disorder - Relatively well-controlled on Effexor, some breakthrough anxiety, will add buspirone at low-dose, plan to taper up. 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

## 2015-12-14 NOTE — Patient Instructions (Signed)
Call or message me in a few weeks and let me know how the Buspirone is working - we have a lot of wiggle room on the dose of this medicine, try it 1/2 - 1 tablet as needed, the prescription is written for up to 3 tablets per day, use your judgment and let me know how it's working for you, we can go up on the dose if needed but let's talk about it before we change things. I will also be placing a referral to counselor at behavioral health. Let us know if you don't hear back about scheduling that appointment.   Will look over old Pap records and determine when you need follow-up for that.   If all is well, plan to see me again in 6 months, but any problems or concerns, or any need for change of medication, please make an appointment to be seen sooner!   Take care! -Dr. Mervyn SkeetersA.

## 2017-01-25 ENCOUNTER — Other Ambulatory Visit: Payer: Self-pay | Admitting: Osteopathic Medicine

## 2017-01-25 DIAGNOSIS — F411 Generalized anxiety disorder: Secondary | ICD-10-CM

## 2017-03-01 ENCOUNTER — Ambulatory Visit (INDEPENDENT_AMBULATORY_CARE_PROVIDER_SITE_OTHER): Payer: Managed Care, Other (non HMO) | Admitting: Osteopathic Medicine

## 2017-03-01 ENCOUNTER — Encounter: Payer: Self-pay | Admitting: Osteopathic Medicine

## 2017-03-01 VITALS — BP 121/80 | HR 60 | Wt 195.0 lb

## 2017-03-01 DIAGNOSIS — Z0000 Encounter for general adult medical examination without abnormal findings: Secondary | ICD-10-CM

## 2017-03-01 DIAGNOSIS — F411 Generalized anxiety disorder: Secondary | ICD-10-CM

## 2017-03-01 DIAGNOSIS — Z8619 Personal history of other infectious and parasitic diseases: Secondary | ICD-10-CM

## 2017-03-01 MED ORDER — BUSPIRONE HCL 10 MG PO TABS: 5.0000 mg | ORAL_TABLET | Freq: Two times a day (BID) | ORAL | 3 refills | Status: DC

## 2017-03-01 MED ORDER — BUSPIRONE HCL 10 MG PO TABS
5.0000 mg | ORAL_TABLET | Freq: Two times a day (BID) | ORAL | 3 refills | Status: DC
Start: 2017-03-01 — End: 2017-07-25

## 2017-03-01 MED ORDER — SERTRALINE HCL 100 MG PO TABS: 50.0000 mg | ORAL_TABLET | Freq: Every day | ORAL | 1 refills | Status: DC

## 2017-03-01 MED ORDER — SERTRALINE HCL 100 MG PO TABS: 100.0000 mg | ORAL_TABLET | Freq: Every day | ORAL | 3 refills | Status: DC

## 2017-03-01 MED ORDER — ACYCLOVIR 400 MG PO TABS: 400.0000 mg | ORAL_TABLET | Freq: Every day | ORAL | 1 refills | Status: DC

## 2017-03-01 NOTE — Progress Notes (Signed)
HPI: [name] is a 26 y.o. female  who presents to [facility] today, 03/01/17,  for chief complaint of:  Chief Complaint  Patient presents with  . Follow-up    medication    Has been off of the Zoloft for about 6 months or so, noticing some increased irritability and anxiety and would like to get back on this medication. Not sure about getting back on the buspirone. She would like to get back on a higher dose of the Zoloft as she thinks this might be a little bit more helpful. Did not have any side effects when starting SSRIs previously. On depression screening, she circled 0 and 1 for suicidal thoughts, passive death wish but no suicidal ideation.  History of cold sores: Famciclovir was not covered by her insurance and was quite expensive last time she tried to get this filled. They switched to something cheaper. Has been off of the suppressive dose for some time and would like to take something as needed only.    Past medical history, surgical history, social history and family history reviewed.  Patient Active Problem List   Diagnosis Date Noted  . Implanon in place 12/14/2015  . 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 Family history of alcohol abuse 12/14/2015  . 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 Generalized anxiety disorder 12/14/2015    Current medication list and allergy/intolerance information reviewed.   Current Outpatient Prescriptions on File Prior to Visit  Medication Sig Dispense Refill  . busPIRone (BUSPAR) 10 MG tablet Take 0.5-1 tablets (5-10 mg total) by mouth 3 (three) times daily as needed (anxiety). 60 tablet 2  . naproxen (NAPROSYN) 500 MG tablet Take 1 tablet (500 mg total) by mouth 2 (two) times daily. 30 tablet 0  . sertraline (ZOLOFT) 50 MG tablet Take 1 tablet (50 mg total) by mouth daily. 90 tablet 1  . valACYclovir (VALTREX) 500 MG tablet Take 1 tablet (500 mg total) by mouth daily. 30 tablet 3   No current  facility-administered medications on file prior to visit.    Allergies  Allergen Reactions  . Sulfa Antibiotics Hives      Review of Systems:  Constitutional: No recent illness  HEENT: No  headache, no vision change  Cardiac: No  chest pain, No  pressure, No palpitations  Respiratory:  No  shortness of breath. No  Cough  Psychiatric: +concerns with depression, +concerns with anxiety  Exam:  BP 121/80   Pulse 60   Wt 195 lb (88.5 kg)   BMI 33.47 kg/m   Constitutional: VS see above. General Appearance: alert, well-developed, well-nourished, NAD  Eyes: Normal lids and conjunctive, non-icteric sclera  Respiratory: Normal respiratory effort.    Musculoskeletal: Gait normal. Symmetric and independent movement of all extremities  Neurological: Normal balance/coordination. No tremor.  Psychiatric: Normal judgment/insight. Normal mood and affect. Oriented x3.     GAD 7 : Generalized Anxiety Score 03/01/2017  Nervous, Anxious, on Edge 2  Control/stop worrying 3  Worry too much - different things 3  Trouble relaxing 2  Restless 2  Easily annoyed or irritable 3  Afraid - awful might happen 2  Total GAD 7 Score 17    Depression screen PHQ 2/9 03/01/2017  Decreased Interest 1  Down, Depressed, Hopeless 1  PHQ - 2 Score 2  Altered sleeping 1  Tired, decreased energy 1  Change in appetite 2  Feeling bad or failure about yourself  2  Trouble concentrating 2  Moving slowly or fidgety/restless 2  Suicidal thoughts 1  PHQ-9 Score 13      ASSESSMENT/PLAN:   Generalized anxiety disorder - Patient wants to get back on higher dose of Zoloft, advised can cut 100 mg tablets if needed in the beginning for 1-2 weeks. Can take BuSpar prn or try daily. 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  Visit summary with medication list and pertinent instructions was printed for patient to review, alert us if any changes needed. All questions at time of visit were answered - patient instructed to contact office with any additional concerns. ER/RTC precautions were reviewed with the patient and understanding verbalized.

## 2017-03-02 LAB — LIPID PANEL
CHOLESTEROL: 177 mg/dL (ref <200)
HDL: 39 mg/dL — ABNORMAL LOW (ref >50)
LDL Cholesterol: 111 mg/dL — ABNORMAL HIGH (ref <100)
Total CHOL/HDL Ratio: 4.5 Ratio (ref <5.0)
Triglycerides: 135 mg/dL (ref <150)
VLDL: 27 mg/dL (ref <30)

## 2017-03-02 LAB — CBC
HEMATOCRIT: 45.3 % — AB (ref 35.0–45.0)
Hemoglobin: 14.7 g/dL (ref 11.7–15.5)
MCH: 31.1 pg (ref 27.0–33.0)
MCHC: 32.5 g/dL (ref 32.0–36.0)
MCV: 96 fL (ref 80.0–100.0)
MPV: 9.7 fL (ref 7.5–12.5)
Platelets: 270 10*3/uL (ref 140–400)
RBC: 4.72 MIL/uL (ref 3.80–5.10)
RDW: 13.3 % (ref 11.0–15.0)
WBC: 7.1 10*3/uL (ref 3.8–10.8)

## 2017-03-02 LAB — COMPLETE METABOLIC PANEL WITH GFR
ALT: 34 U/L — AB (ref 6–29)
AST: 20 U/L (ref 10–30)
Albumin: 3.7 g/dL (ref 3.6–5.1)
Alkaline Phosphatase: 93 U/L (ref 33–115)
BILIRUBIN TOTAL: 0.2 mg/dL (ref 0.2–1.2)
BUN: 9 mg/dL (ref 7–25)
CALCIUM: 9.1 mg/dL (ref 8.6–10.2)
CHLORIDE: 108 mmol/L (ref 98–110)
CO2: 22 mmol/L (ref 20–31)
CREATININE: 0.61 mg/dL (ref 0.50–1.10)
Glucose, Bld: 64 mg/dL — ABNORMAL LOW (ref 65–99)
Potassium: 4.4 mmol/L (ref 3.5–5.3)
Sodium: 138 mmol/L (ref 135–146)
Total Protein: 6.2 g/dL (ref 6.1–8.1)

## 2017-03-02 LAB — TSH: TSH: 1.31 mIU/L

## 2017-03-02 LAB — VITAMIN D 25 HYDROXY (VIT D DEFICIENCY, FRACTURES): Vit D, 25-Hydroxy: 11 ng/mL — ABNORMAL LOW (ref 30–100)

## 2017-03-02 MED ORDER — VITAMIN D (ERGOCALCIFEROL) 1.25 MG (50000 UNIT) PO CAPS: 50000.0000 [IU] | ORAL_CAPSULE | ORAL | 0 refills | Status: DC

## 2017-03-02 NOTE — Addendum Note (Signed)
Addended by: Sunnie NielsenALEXANDER, Manaal Mandala on: 03/02/2017 10:13 AM   Modules accepted: Orders

## 2017-07-25 ENCOUNTER — Ambulatory Visit (INDEPENDENT_AMBULATORY_CARE_PROVIDER_SITE_OTHER): Payer: 59 | Admitting: Osteopathic Medicine

## 2017-07-25 ENCOUNTER — Other Ambulatory Visit (HOSPITAL_COMMUNITY)
Admission: RE | Admit: 2017-07-25 | Discharge: 2017-07-25 | Disposition: A | Discharge status: Home / Self Care | Payer: 59 | Source: Ambulatory Visit | Attending: Family Medicine | Admitting: Family Medicine

## 2017-07-25 ENCOUNTER — Encounter: Payer: Self-pay | Admitting: Osteopathic Medicine

## 2017-07-25 VITALS — BP 126/81 | HR 60 | Ht 64.0 in | Wt 192.0 lb

## 2017-07-25 DIAGNOSIS — Z23 Encounter for immunization: Secondary | ICD-10-CM | POA: Diagnosis not present

## 2017-07-25 DIAGNOSIS — Z0000 Encounter for general adult medical examination without abnormal findings: Secondary | ICD-10-CM

## 2017-07-25 DIAGNOSIS — Z124 Encounter for screening for malignant neoplasm of cervix: Secondary | ICD-10-CM | POA: Diagnosis not present

## 2017-07-25 DIAGNOSIS — F411 Generalized anxiety disorder: Secondary | ICD-10-CM | POA: Diagnosis not present

## 2017-07-25 NOTE — Progress Notes (Signed)
HPI: [name] is a 26 y.o. female with PMH  has a past medical history of Anxiety; Depression; Family history of alcohol abuse (12/14/2015); H/O cold sores (12/14/2015); and Implanon in place (12/14/2015).  who presents to [facility] today, 07/25/17,  for chief complaint of:  Chief Complaint  Patient presents with  . Annual Exam  . Gynecologic Exam       Patient here for annual physical / wellness exam.  See preventive care reviewed as below.  Recent labs reviewed in detail with the patient.   Additional concerns today include:  None    Past medical, surgical, social and family history reviewed:  Patient Active Problem List   Diagnosis Date Noted  . Implanon in place 12/14/2015  . 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 Family history of alcohol abuse 12/14/2015  . 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 Generalized anxiety disorder 12/14/2015    Past Surgical History:  Procedure Laterality Date  . BACK SURGERY      Social History  Substance Use Topics  . Smoking status: Current Every Day Smoker  . Smokeless tobacco: Never Used  . Alcohol use Yes     Comment: socially    Family History  Problem Relation Age of Onset  . Depression Mother   . Alcohol abuse Father   . Hypertension Maternal Uncle   . Depression Maternal Grandmother   . Cancer Maternal Grandfather   . Cancer Paternal Grandmother      Current medication list and allergy/intolerance information reviewed:    Current Outpatient Prescriptions  Medication Sig Dispense Refill  . acyclovir (ZOVIRAX) 400 MG tablet Take 1 tablet (400 mg total) by mouth 5 (five) times daily. For 5 days - start at first sign of cold sore 50 tablet 1  . busPIRone (BUSPAR) 10 MG tablet Take 0.5-1 tablets (5-10 mg total) by mouth 2 (two) times daily. 90 tablet 3  . naproxen (NAPROSYN) 500 MG tablet Take 1 tablet (500 mg total) by mouth 2 (two) times daily. (Patient not taking: Reported on  03/01/2017) 30 tablet 0  . sertraline (ZOLOFT) 100 MG tablet Take 1 tablet (100 mg total) by mouth daily. 90 tablet 3  . Vitamin D, Ergocalciferol, (DRISDOL) 50000 units CAPS capsule Take 1 capsule (50,000 Units total) by mouth every 7 (seven) days. Take for 12 total doses(weeks) 12 capsule 0   No current facility-administered medications for this visit.     Allergies  Allergen Reactions  . Sulfa Antibiotics Hives      Review of Systems:  Constitutional:  No  fever, no chills, No recent illness, No unintentional weight changes. No significant fatigue.   HEENT: No  headache, no vision change, no hearing change, No sore throat, No  sinus pressure  Cardiac: No  chest pain, No  pressure, No palpitations, No  Orthopnea  Respiratory:  No  shortness of breath. No  Cough  Gastrointestinal: No  abdominal pain, No  nausea, No  vomiting,  No  blood in stool, No  diarrhea, No  constipation   Musculoskeletal: No new myalgia/arthralgia  Genitourinary: No  incontinence, No  abnormal genital bleeding, No abnormal genital discharge  Skin: No  Rash, No other wounds/concerning lesions  Hem/Onc: No  easy bruising/bleeding, No  abnormal lymph node  Endocrine: No cold intolerance,  No heat intolerance. No polyuria/polydipsia/polyphagia   Neurologic: No  weakness, No  dizziness, No  slurred speech/focal weakness/facial droop  Psychiatric: No  concerns with depression, No  concerns with anxiety, No sleep problems, No mood problems  Exam:  BP 126/81   Pulse 60   Ht 5\' 4"  (1.626 m)   Wt 192 lb (87.1 kg)   LMP  (LMP Unknown)   BMI 32.96 kg/m   Constitutional: VS see above. General Appearance: alert, well-developed, well-nourished, NAD  Eyes: Normal lids and conjunctive, non-icteric sclera  Ears, Nose, Mouth, Throat: MMM, Normal external inspection ears/nares/mouth/lips/gums. TM normal bilaterally. Pharynx/tonsils no erythema, no exudate. Nasal mucosa normal.   Neck: No masses, trachea  midline. No thyroid enlargement. No tenderness/mass appreciated. No lymphadenopathy  Respiratory: Normal respiratory effort. no wheeze, no rhonchi, no rales  Cardiovascular: S1/S2 normal, no murmur, no rub/gallop auscultated. RRR. No lower extremity edema. Pedal pulse II/IV bilaterally DP and PT. No carotid bruit or JVD. No abdominal aortic bruit.  Gastrointestinal: Nontender, no masses. No hepatomegaly, no splenomegaly. No hernia appreciated. Bowel sounds normal. Rectal exam deferred.   Musculoskeletal: Gait normal. No clubbing/cyanosis of digits.   Neurological: Normal balance/coordination. No tremor. No cranial nerve deficit on limited exam. Motor and sensation intact and symmetric. Cerebellar reflexes intact.   Skin: warm, dry, intact. No rash/ulcer. No concerning nevi or subq nodules on limited exam.    Psychiatric: Normal judgment/insight. Normal mood and affect. Oriented x3.  GYN: No lesions/ulcers to external genitalia, normal urethra, normal vaginal mucosa, physiologic discharge, cervix normal without lesions, uterus not enlarged or tender, adnexa no masses and nontender  BREAST: No rashes/skin changes, normal fibrous breast tissue, no masses or tenderness, normal nipple without discharge, normal axilla      ASSESSMENT/PLAN:   Physical exam, annual - Plan: CBC, COMPLETE METABOLIC PANEL WITH GFR, VITAMIN D 25 Hydroxy (Vit-D Deficiency, Fractures), Lipid panel  Cervical cancer screening - Plan: Cytology - PAP     FEMALE PREVENTIVE CARE Updated 07/25/17   ANNUAL SCREENING/COUNSELING  Diet/Exercise - HEALTHY HABITS DISCUSSED TO DECREASE CV RISK History  Smoking Status  . Current Every Day Smoker  Smokeless Tobacco  . Never Used   History  Alcohol Use  . Yes    Comment: socially   Depression screen [facility] 2/9 03/01/2017  Decreased Interest 1  Down, Depressed, Hopeless 1  PHQ - 2 Score 2  Altered sleeping 1  Tired, decreased energy 1  Change in appetite 2  Feeling  bad or failure about yourself  2  Trouble concentrating 2  Moving slowly or fidgety/restless 2  Suicidal thoughts 1  PHQ-9 Score 13    Domestic violence concerns - no  HTN SCREENING - SEE VITALS  SEXUAL HEALTH  Sexually active in the past year - Yes with female.  Need/want STI testing today? - no  Concerns about libido or pain with sex? - no  Plans for pregnancy? - none at this time - Nexplanon in place <3 years  INFECTIOUS DISEASE SCREENING  HIV - needs - de clined  GC/CT - needs - decelind  HepC - DOB [birth year] - does not need  TB - does not need  DISEASE SCREENING  Lipid - needs  DM2 - does not need  Osteoporosis - women age 71+ - does not need  CANCER SCREENING  Cervical - needs  Breast - does not need  Lung - does not need  Colon - does not need  ADULT VACCINATION  Influenza - annual vaccine recommended  Td - booster every 10 years   Zoster - Shingrix recommended 50+  PCV13 - was not indicated  PPSV23 - was not indicated There is no  immunization history for the selected administration types on file for this patient.   Visit summary with medication list and pertinent instructions was printed for patient to review. All questions at time of visit were answered - patient instructed to contact office with any additional concerns. ER/RTC precautions were reviewed with the patient. Follow-up plan: Return for annual physical in one year, sooner if needed . OK to refill meds until then.     Please note: voice recognition software was used to produce this document, and typos may escape review. Please contact me for any needed clarifications.

## 2017-07-26 ENCOUNTER — Telehealth: Payer: Self-pay | Admitting: Osteopathic Medicine

## 2017-07-26 MED ORDER — SERTRALINE HCL 100 MG PO TABS: 100.0000 mg | ORAL_TABLET | Freq: Every day | ORAL | 3 refills | Status: DC

## 2017-07-26 NOTE — Telephone Encounter (Signed)
Test

## 2017-07-27 LAB — CYTOLOGY - PAP: DIAGNOSIS: NEGATIVE

## 2017-08-30 ENCOUNTER — Encounter: Payer: Self-pay | Admitting: Family Medicine

## 2017-08-30 ENCOUNTER — Ambulatory Visit (INDEPENDENT_AMBULATORY_CARE_PROVIDER_SITE_OTHER): Payer: 59 | Admitting: Family Medicine

## 2017-08-30 VITALS — BP 122/80 | HR 73 | Temp 97.7°F | Wt 186.0 lb

## 2017-08-30 DIAGNOSIS — R059 Cough, unspecified: Secondary | ICD-10-CM

## 2017-08-30 DIAGNOSIS — R05 Cough: Secondary | ICD-10-CM | POA: Diagnosis not present

## 2017-08-30 DIAGNOSIS — J069 Acute upper respiratory infection, unspecified: Secondary | ICD-10-CM

## 2017-08-30 MED ORDER — BENZONATATE 200 MG PO CAPS: 200.0000 mg | ORAL_CAPSULE | Freq: Three times a day (TID) | ORAL | 1 refills | Status: DC | PRN

## 2017-08-30 MED ORDER — GUAIFENESIN-CODEINE 100-10 MG/5ML PO SOLN: 5.0000 mL | Freq: Every evening | ORAL | 0 refills | Status: DC | PRN

## 2017-08-30 NOTE — Progress Notes (Signed)
[name] is a 26 y.o. female who presents to [name] today for sore throat associated with a mild cough.  [name] OreChristina initially had a high fever body aches and fatigue about 72-48 hours ago.  The fever has improved and resolved.  The body aches and fatigue are also improving but still present.  The sore throat is mild but still present.  The cough is additionally nonproductive mild but obnoxious and interferes with sleep.  She is tried over-the-counter medications which help a little.   Past Medical History:  Diagnosis Date  . Anxiety   . Depression   . Family history of alcohol abuse 12/14/2015  . 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 Implanon in place 12/14/2015   06/2015     Past Surgical History:  Procedure Laterality Date  . BACK SURGERY     Social History   Tobacco Use  . Smoking status: Current Every Day Smoker  . Smokeless tobacco: Never Used  Substance Use Topics  . Alcohol use: Yes    Comment: socially   family history includes Alcohol abuse in her father; Cancer in her maternal grandfather and paternal grandmother; Depression in her maternal grandmother and mother; Hypertension in her maternal uncle.  ROS as above:  Medications: Current Outpatient Medications  Medication Sig Dispense Refill  . acyclovir (ZOVIRAX) 400 MG tablet Take 1 tablet (400 mg total) by mouth 5 (five) times daily. For 5 days - start at first sign of cold sore 50 tablet 1  . sertraline (ZOLOFT) 100 MG tablet Take 1 tablet (100 mg total) by mouth daily. Ok to refill until 07/2018 90 tablet 3  . benzonatate (TESSALON) 200 MG capsule Take 1 capsule (200 mg total) by mouth 3 (three) times daily as needed for cough. 45 capsule 1  . guaiFENesin-codeine 100-10 MG/5ML syrup Take 5 mLs by mouth at bedtime as needed for cough. 120 mL 0   No current facility-administered medications for  this visit.    Allergies  Allergen Reactions  . Sulfa Antibiotics Hives    Health Maintenance Health Maintenance  Topic Date Due  . HIV Screening  09/25/2017 (Originally 05/15/2004)  . INFLUENZA VACCINE  08/01/2018 (Originally 04/25/2017)  . PAP SMEAR  07/25/2020  . TETANUS/TDAP  07/26/2027     Exam:  BP 122/80   Pulse 73   Temp 97.7 F (36.5 C) (Oral)   Wt 186 lb (84.4 kg)   BMI 31.93 kg/m  Gen: Well NAD HEENT: EOMI,  MMM posterior pharynx mildly erythematous with cobblestoning.  Normal tympanic membranes bilaterally.  No significant cervical lymphadenopathy Lungs: Normal work of breathing. CTABL Heart: RRR no MRG Abd: NABS, Soft. Nondistended, Nontender Exts: Brisk capillary refill, warm and well perfused.    No results found for this or any previous visit (from the past 72 hour(s)). No results found.    Assessment and Plan: 26 y.o. female with viral URI with cough.  Improving.  Plan for symptomatic management with over-the-counter medications codeine cough syrup and Tessalon Perles.  Recheck as needed.   No orders of the defined types were placed in this encounter.  Meds ordered this encounter  Medications  . benzonatate (TESSALON) 200 MG capsule    Sig: Take 1 capsule (200 mg total) by mouth 3 (three) times daily as needed for cough.    Dispense:  45 capsule    Refill:  1  . guaiFENesin-codeine 100-10 MG/5ML syrup    Sig:  Take 5 mLs by mouth at bedtime as needed for cough.    Dispense:  120 mL    Refill:  0     Discussed warning signs or symptoms. Please see discharge instructions. Patient expresses understanding.

## 2017-08-30 NOTE — Patient Instructions (Signed)
Thank you for coming in today. Take tessalon pearles for cough during the day.  Continue over the counter medicines.  Return if not better.  Call or go to the emergency room if you get worse, have trouble breathing, have chest pains, or palpitations.     Upper Respiratory Infection, Adult Most upper respiratory infections (URIs) are caused by a virus. A URI affects the nose, throat, and upper air passages. The most common type of URI is often called "the common cold." Follow these instructions at home:  Take medicines only as told by your doctor.  Gargle warm saltwater or take cough drops to comfort your throat as told by your doctor.  Use a warm mist humidifier or inhale steam from a shower to increase air moisture. This may make it easier to breathe.  Drink enough fluid to keep your pee (urine) clear or pale yellow.  Eat soups and other clear broths.  Have a healthy diet.  Rest as needed.  Go back to work when your fever is gone or your doctor says it is okay. ? You may need to stay home longer to avoid giving your URI to others. ? You can also wear a face mask and wash your hands often to prevent spread of the virus.  Use your inhaler more if you have asthma.  Do not use any tobacco products, including cigarettes, chewing tobacco, or electronic cigarettes. If you need help quitting, ask your doctor. Contact a doctor if:  You are getting worse, not better.  Your symptoms are not helped by medicine.  You have chills.  You are getting more short of breath.  You have brown or red mucus.  You have yellow or brown discharge from your nose.  You have pain in your face, especially when you bend forward.  You have a fever.  You have puffy (swollen) neck glands.  You have pain while swallowing.  You have white areas in the back of your throat. Get help right away if:  You have very bad or constant: ? Headache. ? Ear pain. ? Pain in your forehead, behind your  eyes, and over your cheekbones (sinus pain). ? Chest pain.  You have long-lasting (chronic) lung disease and any of the following: ? Wheezing. ? Long-lasting cough. ? Coughing up blood. ? A change in your usual mucus.  You have a stiff neck.  You have changes in your: ? Vision. ? Hearing. ? Thinking. ? Mood. This information is not intended to replace advice given to you by your health care provider. Make sure you discuss any questions you have with your health care provider. Document Released: 02/28/2008 Document Revised: 05/14/2016 Document Reviewed: 12/17/2013 Elsevier Interactive Patient Education  2018 [employer]

## 2017-12-20 ENCOUNTER — Other Ambulatory Visit: Payer: Self-pay | Admitting: Osteopathic Medicine

## 2018-02-12 ENCOUNTER — Ambulatory Visit: Payer: 59 | Admitting: Osteopathic Medicine

## 2018-05-03 ENCOUNTER — Other Ambulatory Visit: Payer: Self-pay | Admitting: Osteopathic Medicine

## 2018-05-03 DIAGNOSIS — F411 Generalized anxiety disorder: Secondary | ICD-10-CM

## 2018-08-09 ENCOUNTER — Other Ambulatory Visit: Payer: Self-pay | Admitting: Osteopathic Medicine

## 2018-08-09 DIAGNOSIS — F411 Generalized anxiety disorder: Secondary | ICD-10-CM

## 2018-08-12 MED ORDER — SERTRALINE HCL 100 MG PO TABS: 100.0000 mg | ORAL_TABLET | Freq: Every day | ORAL | 0 refills | Status: DC

## 2018-08-19 ENCOUNTER — Ambulatory Visit: Payer: 59 | Admitting: Osteopathic Medicine

## 2018-08-28 ENCOUNTER — Ambulatory Visit: Payer: 59 | Admitting: Osteopathic Medicine

## 2019-03-20 ENCOUNTER — Encounter: Payer: 59 | Admitting: Osteopathic Medicine

## 2019-04-01 ENCOUNTER — Other Ambulatory Visit: Payer: Self-pay

## 2019-04-01 ENCOUNTER — Ambulatory Visit (INDEPENDENT_AMBULATORY_CARE_PROVIDER_SITE_OTHER): Payer: 59 | Admitting: Osteopathic Medicine

## 2019-04-01 ENCOUNTER — Encounter: Payer: Self-pay | Admitting: Osteopathic Medicine

## 2019-04-01 ENCOUNTER — Telehealth: Payer: Self-pay | Admitting: Osteopathic Medicine

## 2019-04-01 VITALS — BP 116/84 | HR 70 | Temp 99.0°F | Wt 201.1 lb

## 2019-04-01 DIAGNOSIS — Z8619 Personal history of other infectious and parasitic diseases: Secondary | ICD-10-CM | POA: Diagnosis not present

## 2019-04-01 DIAGNOSIS — Z3046 Encounter for surveillance of implantable subdermal contraceptive: Secondary | ICD-10-CM | POA: Diagnosis not present

## 2019-04-01 DIAGNOSIS — Z30017 Encounter for initial prescription of implantable subdermal contraceptive: Secondary | ICD-10-CM | POA: Diagnosis not present

## 2019-04-01 DIAGNOSIS — F411 Generalized anxiety disorder: Secondary | ICD-10-CM

## 2019-04-01 DIAGNOSIS — Z0000 Encounter for general adult medical examination without abnormal findings: Secondary | ICD-10-CM | POA: Diagnosis not present

## 2019-04-01 MED ORDER — ALPRAZOLAM 0.5 MG PO TABS: 0.2500 mg | ORAL_TABLET | Freq: Two times a day (BID) | ORAL | 0 refills | Status: DC | PRN

## 2019-04-01 MED ORDER — SERTRALINE HCL 100 MG PO TABS: 100.0000 mg | ORAL_TABLET | Freq: Every day | ORAL | 3 refills | Status: DC

## 2019-04-01 MED ORDER — ACYCLOVIR 400 MG PO TABS: ORAL_TABLET | ORAL | 1 refills | Status: DC

## 2019-04-01 NOTE — Telephone Encounter (Signed)
Patient called stating that she will get the blood work done before her visit next week. States that she could not get it done today because the wait was an hour long. Wanted to let her PCP know.

## 2019-04-01 NOTE — Progress Notes (Signed)
Nexplanon Removal Procedure Note PRE-OP DIAGNOSIS: Nexplanon, desire for change of contraception  POST-OP DIAGNOSIS: Same  PROCEDURE: Nexplanon Removal  Performing Physician:A A;exander PROCEDURE:  Anesthesia: 2% Lidocaine w/ epinephrine 5 ml  Procedure: Consent obtained. A time-out was performed prior to initiating procedure to be sure of right patient and right location. The area surrounding the Nexplanon was prepared in the usual sterile manner. The site was anesthetized with lidocaine. A skin incision was made over the distal aspect of the device. The capsule lysed sharply and the device removed using a hemostat. Hemostasis was assured. The site was dressed with SteriStrips. The patient tolerated the procedure well.  Followup: The patient tolerated the procedure well without complications. Standard post-procedure care is explained and return precautions are given. Contraception is advised until conception is desired.  NEXPLANON INSERTION PRE-OP DIAGNOSIS: desired long-term, reversible contraception  POST-OP DIAGNOSIS: Same  PROCEDURE: Nexplanon  placement Performing Physician: Dr. Emeterio Reeve  Risks and benefits reviewed with the patient. Informed consent obtained, patient opts to proceed today.    PROCEDURE:  Site (check): left arm  Sterile Preparation: Chlorhexidine  Current Nexplanon device was removed as above.  Nexplanon  trocar was inserted subcutaneously and then Nexplanon  capsule delivered subcutaneously Trocar was removed from the insertion site. Nexplanon  capsule was palpated by provider and patient to assure satisfactory placement. Estimated blood loss <1 mL Dressings applied: 2 simple interrupted suture w/ 4.0 prolene and small pressure bandage  Followup: The patient tolerated the procedure well without complications.  Standard post-procedure care is explained and return precautions are given.

## 2019-04-01 NOTE — Telephone Encounter (Signed)
Ok that's fine

## 2019-04-01 NOTE — Progress Notes (Signed)
HPI: [name] is a 28 y.o. female who  has a past medical history of Anxiety, Depression, Family history of alcohol abuse (12/14/2015), H/O cold sores (12/14/2015), and Implanon in place (12/14/2015).  she presents to [facility] today, 04/01/19,  for chief complaint of: Annual physical     Patient here for annual physical / wellness exam.  See preventive care reviewed as below.   Additional concerns today include:   Anxiety has been a bit worse for her over the past couple of months with everything going on with the pandemic and her personal life.  She would like to get back on alprazolam for sparing use.  As well as continue Zoloft, which she was off of for a bit but thinks that this was not beneficial for her.  Needs refill on acyclovir.    Past medical, surgical, social and family history reviewed:  Patient Active Problem List   Diagnosis Date Noted  . Implanon in place 12/14/2015  . 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 Family history of alcohol abuse 12/14/2015  . 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 Generalized anxiety disorder 12/14/2015    Past Surgical History:  Procedure Laterality Date  . BACK SURGERY      Social History   Tobacco Use  . Smoking status: Current Every Day Smoker  . Smokeless tobacco: Never Used  Substance Use Topics  . Alcohol use: Yes    Comment: socially    Family History  Problem Relation Age of Onset  . Depression Mother   . Alcohol abuse Father   . Hypertension Maternal Uncle   . Depression Maternal Grandmother   . Cancer Maternal Grandfather   . Cancer Paternal Grandmother      Current medication list and allergy/intolerance information reviewed:    Current Outpatient Medications  Medication Sig Dispense Refill  . acyclovir (ZOVIRAX) 400 MG tablet TAKE 1 TABLET BY MOUTH 5 TIMES DAILY FOR 5 DAYS START AT 1ST SIGN OF COLD SORE 50 tablet 1  . benzonatate (TESSALON) 200 MG capsule Take 1  capsule (200 mg total) by mouth 3 (three) times daily as needed for cough. 45 capsule 1  . guaiFENesin-codeine 100-10 MG/5ML syrup Take 5 mLs by mouth at bedtime as needed for cough. 120 mL 0  . sertraline (ZOLOFT) 100 MG tablet Take 1 tablet (100 mg total) by mouth daily. Ok to refill until 07/2018 90 tablet 3  . sertraline (ZOLOFT) 100 MG tablet Take 1 tablet (100 mg total) by mouth daily. No refills. Pt needs annual appt w/Provider. 30 tablet 0   No current facility-administered medications for this visit.     Allergies  Allergen Reactions  . Sulfa Antibiotics Hives      Review of Systems:  Constitutional:  No  fever, no chills, No recent illness, No unintentional weight changes. No significant fatigue.   HEENT: No  headache, no vision change, no hearing change, No sore throat, No  sinus pressure  Cardiac: No  chest pain, No  pressure, No palpitations, No  Orthopnea  Respiratory:  No  shortness of breath. No  Cough  Gastrointestinal: No  abdominal pain, No  nausea, No  vomiting,  No  blood in stool, No  diarrhea, No  constipation   Musculoskeletal: No new myalgia/arthralgia  Skin: No  Rash, No other wounds/concerning lesions  Genitourinary: No  incontinence, No  abnormal genital bleeding, No abnormal genital discharge  Hem/Onc: No  easy bruising/bleeding, No  abnormal lymph node  Endocrine: No cold intolerance,  No heat intolerance. No polyuria/polydipsia/polyphagia   Neurologic: No  weakness, No  dizziness, No  slurred speech/focal weakness/facial droop  Psychiatric: No  concerns with depression, +concerns with anxiety, No sleep problems, No mood problems  Exam:  BP 116/84 (BP Location: Left Arm, Patient Position: Sitting, Cuff Size: Normal)   Pulse 70   Temp 99 F (37.2 C) (Oral)   Wt 201 lb 1.6 oz (91.2 kg)   BMI 34.52 kg/m   Constitutional: VS see above. General Appearance: alert, well-developed, well-nourished, NAD  Eyes: Normal lids and conjunctive,  non-icteric sclera  Neck: No masses, trachea midline. No thyroid enlargement. No tenderness/mass appreciated. No lymphadenopathy  Respiratory: Normal respiratory effort. no wheeze, no rhonchi, no rales  Cardiovascular: S1/S2 normal, no murmur, no rub/gallop auscultated. RRR. No lower extremity edema. Pedal pulse II/IV bilaterally DP and PT. No carotid bruit or JVD. No abdominal aortic bruit.  Gastrointestinal: Nontender, no masses. No hepatomegaly, no splenomegaly. No hernia appreciated. Bowel sounds normal. Rectal exam deferred.   Musculoskeletal: Gait normal. No clubbing/cyanosis of digits.   Neurological: Normal balance/coordination. No tremor. No cranial nerve deficit on limited exam. Motor and sensation intact and symmetric. Cerebellar reflexes intact.   Skin: warm, dry, intact. Se eprocedure notes   Psychiatric: Normal judgment/insight. Normal mood and affect. Oriented x3.    No results found for this or any previous visit (from the past 72 hour(s)).  No results found.   ASSESSMENT/PLAN: The primary encounter diagnosis was Annual physical exam. Diagnoses of Generalized anxiety disorder, H/O cold sores, Generalized anxiety disorder, Nexplanon insertion, and Nexplanon removal were also pertinent to this visit.   Orders Placed This Encounter  Procedures  . CBC  . COMPLETE METABOLIC PANEL WITH GFR  . Lipid panel  . TSH    Meds ordered this encounter  Medications  . sertraline (ZOLOFT) 100 MG tablet    Sig: Take 1 tablet (100 mg total) by mouth daily.    Dispense:  90 tablet    Refill:  3  . acyclovir (ZOVIRAX) 400 MG tablet    Sig: TAKE 1 TABLET BY MOUTH 5 TIMES DAILY FOR 5 DAYS START AT 1ST SIGN OF COLD SORE    Dispense:  50 tablet    Refill:  1  . ALPRAZolam (XANAX) 0.5 MG tablet    Sig: Take 0.5-1 tablets (0.25-0.5 mg total) by mouth 2 (two) times daily as needed for anxiety.    Dispense:  30 tablet    Refill:  0    Patient Instructions  General Preventive  Care  Most recent routine screening lipids/other labs: ordered today   Everyone should have blood pressure checked once per year.   Tobacco: don't! Please let me know if you need help quitting!  Alcohol: responsible moderation is ok for most adults - if you have concerns about your alcohol intake, please talk to me!   Exercise: as tolerated to reduce risk of cardiovascular disease and diabetes. Strength training will also prevent osteoporosis.   Mental health: if need for mental health care (medicines, counseling, other), or concerns about moods, please let me know!   Sexual health: if need for STD testing, or if concerns with libido/pain problems, please let me know! If you need to discuss your birth control options, please let me know!   Advanced Directive: Living Will and/or Healthcare Power of Attorney recommended for all adults, regardless of age or health.  Vaccines  Flu vaccine: recommended for almost everyone, every fall.  Tetanus booster: Tdap recommended every 10 years.   HPV vaccine: Gardasil recommended up to age 30 to prevent HPV-associated diseases, including certain cancers.  Cancer screenings   Colon cancer screening: recommended for everyone at age 30, but some folks need a colonoscopy sooner if risk factors  Breast cancer screening: mammogram recommended starting at age 30   Cervical cancer screening: Pap every 1 to 5 years depending on age and other risk factors. Can usually stop at age 30 or w/ hysterectomy.   Lung cancer screening: not needed in your 50's if you can quit smoking!  Infection screenings . HIV: recommended screening at least once age 30-65, more often as needed. . Gonorrhea/Chlamydia: screening as needed. . Hepatitis C: recommended for anyone born 1967 . TB: certain at-risk populations, or depending on work requirements and/or travel history Other . Bone Density Test: recommended for women at age 30, at age 30 for smokers.         Visit summary with medication list and pertinent instructions was printed for patient to review. All questions at time of visit were answered - patient instructed to contact office with any additional concerns or updates. ER/RTC precautions were reviewed with the patient.    Please note: voice recognition software was used to produce this document, and typos may escape review. Please contact Dr. Lyn HollingsheadAlexander for any needed clarifications.     Follow-up plan: Return for ANNUAL (call week prior to visit for lab orders).

## 2019-04-01 NOTE — Patient Instructions (Addendum)
General Preventive Care  Most recent routine screening lipids/other labs: ordered today   Everyone should have blood pressure checked once per year.   Tobacco: don't! Please let me know if you need help quitting!  Alcohol: responsible moderation is ok for most adults - if you have concerns about your alcohol intake, please talk to me!   Exercise: as tolerated to reduce risk of cardiovascular disease and diabetes. Strength training will also prevent osteoporosis.   Mental health: if need for mental health care (medicines, counseling, other), or concerns about moods, please let me know!   Sexual health: if need for STD testing, or if concerns with libido/pain problems, please let me know! If you need to discuss your birth control options, please let me know!   Advanced Directive: Living Will and/or Healthcare Power of Attorney recommended for all adults, regardless of age or health.  Vaccines  Flu vaccine: recommended for almost everyone, every fall.   Tetanus booster: Tdap recommended every 10 years.   HPV vaccine: Gardasil recommended up to age 37 to prevent HPV-associated diseases, including certain cancers.  Cancer screenings   Colon cancer screening: recommended for everyone at age 69, but some folks need a colonoscopy sooner if risk factors  Breast cancer screening: mammogram recommended starting at age 41   Cervical cancer screening: Pap every 1 to 5 years depending on age and other risk factors. Can usually stop at age 22 or w/ hysterectomy.   Lung cancer screening: not needed in your 50's if you can quit smoking!  Infection screenings . HIV: recommended screening at least once age 49-65, more often as needed. . Gonorrhea/Chlamydia: screening as needed. . Hepatitis C: recommended for anyone born 76-1967 . TB: certain at-risk populations, or depending on work requirements and/or travel history Other . Bone Density Test: recommended for women at age 32, at age 84 for  smokers.

## 2019-04-03 ENCOUNTER — Encounter: Payer: Self-pay | Admitting: Osteopathic Medicine

## 2019-04-08 ENCOUNTER — Other Ambulatory Visit: Payer: Self-pay

## 2019-04-08 ENCOUNTER — Ambulatory Visit (INDEPENDENT_AMBULATORY_CARE_PROVIDER_SITE_OTHER): Payer: 59 | Admitting: Osteopathic Medicine

## 2019-04-08 ENCOUNTER — Encounter: Payer: Self-pay | Admitting: Osteopathic Medicine

## 2019-04-08 VITALS — BP 139/75 | HR 70 | Temp 99.1°F | Wt 200.3 lb

## 2019-04-08 DIAGNOSIS — Z4802 Encounter for removal of sutures: Secondary | ICD-10-CM

## 2019-04-08 NOTE — Progress Notes (Signed)
HPI: [name] is a 28 y.o. female who  has a past medical history of Cardiomegaly (09/08/2015), Elevated blood pressure (09/03/2015), Family history of ovarian cancer (09/03/2015), GERD (gastroesophageal reflux disease) (07/14/2015), Hypertension, and Other allergic rhinitis (02/17/2016).  she presents to [facility] today, 04/08/19,  for chief complaint of:  Suture removal  Nexplanon removed and replaced at recent visit, healing well, no problems      ASSESSMENT/PLAN: The encounter diagnosis was Visit for suture removal.   Healing well  Sutures removed  Small steri-strip applied   Wound care reviewed, no questions   Pt getting labs done today     Follow-up plan: Return for RECHECK PENDING RESULTS / IF WORSE OR CHANGE.                                                 ################################################# ################################################# ################################################# #################################################    No outpatient medications have been marked as taking for the 04/08/19 encounter (Appointment) with Emeterio Reeve, DO.    Allergies  Allergen Reactions  . Codeine Nausea And Vomiting       Review of Systems:  Constitutional: No recent illness  Skin: No  Rash   Exam:  There were no vitals taken for this visit.  Constitutional: VS see above. General Appearance: alert, well-developed, well-nourished, NAD  Skin: warm, dry, intact. Sutures removed from incision on L upper arm, no erythema or drainage, skin is helaing normally        Visit summary with medication list and pertinent instructions was printed for patient to review, patient was advised to alert [place] if any updates are needed. All questions at time of visit were answered - patient instructed to contact office with any additional concerns.  ER/RTC precautions were reviewed with the patient and understanding verbalized.      Please note: voice recognition software was used to produce this document, and typos may escape review. Please contact Dr. Sheppard Coil for any needed clarifications.    Follow up plan: Return for RECHECK PENDING RESULTS / IF WORSE OR CHANGE.

## 2019-04-09 LAB — COMPLETE METABOLIC PANEL WITH GFR
AG Ratio: 1.8 (calc) (ref 1.0–2.5)
ALT: 39 U/L — ABNORMAL HIGH (ref 6–29)
AST: 18 U/L (ref 10–30)
Albumin: 4.1 g/dL (ref 3.6–5.1)
Alkaline phosphatase (APISO): 91 U/L (ref 31–125)
BUN: 10 mg/dL (ref 7–25)
CO2: 24 mmol/L (ref 20–32)
Calcium: 9.5 mg/dL (ref 8.6–10.2)
Chloride: 108 mmol/L (ref 98–110)
Creat: 0.59 mg/dL (ref 0.50–1.10)
GFR, Est African American: 144 mL/min/{1.73_m2} (ref >=60)
GFR, Est Non African American: 124 mL/min/{1.73_m2} (ref >=60)
Globulin: 2.3 g/dL (calc) (ref 1.9–3.7)
Glucose, Bld: 91 mg/dL (ref 65–99)
Potassium: 4.2 mmol/L (ref 3.5–5.3)
Sodium: 138 mmol/L (ref 135–146)
Total Bilirubin: 0.5 mg/dL (ref 0.2–1.2)
Total Protein: 6.4 g/dL (ref 6.1–8.1)

## 2019-04-09 LAB — LIPID PANEL
Cholesterol: 219 mg/dL — ABNORMAL HIGH (ref <200)
HDL: 40 mg/dL — ABNORMAL LOW (ref >=50)
LDL Cholesterol (Calc): 143 mg/dL (calc) — ABNORMAL HIGH
Non-HDL Cholesterol (Calc): 179 mg/dL (calc) — ABNORMAL HIGH (ref <130)
Total CHOL/HDL Ratio: 5.5 (calc) — ABNORMAL HIGH (ref <5.0)
Triglycerides: 218 mg/dL — ABNORMAL HIGH (ref <150)

## 2019-04-09 LAB — TSH: TSH: 0.96 mIU/L

## 2019-04-09 LAB — CBC
HCT: 44.4 % (ref 35.0–45.0)
Hemoglobin: 14.7 g/dL (ref 11.7–15.5)
MCH: 30.9 pg (ref 27.0–33.0)
MCHC: 33.1 g/dL (ref 32.0–36.0)
MCV: 93.3 fL (ref 80.0–100.0)
MPV: 9.9 fL (ref 7.5–12.5)
Platelets: 296 10*3/uL (ref 140–400)
RBC: 4.76 10*6/uL (ref 3.80–5.10)
RDW: 12.4 % (ref 11.0–15.0)
WBC: 8.3 10*3/uL (ref 3.8–10.8)

## 2019-05-29 ENCOUNTER — Other Ambulatory Visit: Payer: Self-pay | Admitting: Osteopathic Medicine

## 2019-05-29 NOTE — Telephone Encounter (Signed)
Forwarding medication refill request to PCP for review. 

## 2019-07-04 ENCOUNTER — Other Ambulatory Visit: Payer: Self-pay | Admitting: Osteopathic Medicine

## 2019-07-04 NOTE — Telephone Encounter (Signed)
[facility] requesting med refill for alprazolam.  

## 2019-07-04 NOTE — Telephone Encounter (Signed)
Requested medication (s) are due for refill today: yes  Requested medication (s) are on the active medication list: yes   Last refill:  05/30/2019  Future visit scheduled: no  Notes to clinic:  Refill cannot be delegated    Requested Prescriptions  Pending Prescriptions Disp Refills   ALPRAZolam (XANAX) 0.5 MG tablet [Pharmacy Med Name: ALPRAZOLAM 0.5 MG TABLET] 30 tablet 0    Sig: TAKE 1/2 TO 1 TABLET BY MOUTH 2 TIMES A DAY AS NEEDED FOR ANXIETY     Not Delegated - Psychiatry:  Anxiolytics/Hypnotics Failed - 07/04/2019  3:11 PM      Failed - This refill cannot be delegated      Failed - Urine Drug Screen completed in last 360 days.      Passed - Valid encounter within last 6 months    Recent Outpatient Visits          2 months ago Visit for suture removal   [facility], Lanelle Bal, DO   3 months ago Annual physical exam   [facility], Lanelle Bal, DO   1 year ago Cough    [facility], Rebekah [place], MD   1 year ago Physical exam, annual   [facility], Lanelle Bal, DO   2 years ago Generalized anxiety disorder   [facility], [place], DO

## 2019-08-10 ENCOUNTER — Other Ambulatory Visit: Payer: Self-pay | Admitting: Osteopathic Medicine

## 2019-09-01 ENCOUNTER — Ambulatory Visit (INDEPENDENT_AMBULATORY_CARE_PROVIDER_SITE_OTHER): Payer: 59 | Admitting: Osteopathic Medicine

## 2019-09-01 ENCOUNTER — Other Ambulatory Visit: Payer: Self-pay

## 2019-09-01 ENCOUNTER — Encounter: Payer: Self-pay | Admitting: Osteopathic Medicine

## 2019-09-01 VITALS — BP 120/77 | HR 79 | Temp 98.2°F | Wt 192.1 lb

## 2019-09-01 DIAGNOSIS — Z113 Encounter for screening for infections with a predominantly sexual mode of transmission: Secondary | ICD-10-CM

## 2019-09-01 LAB — WET PREP FOR TRICH, YEAST, CLUE
MICRO NUMBER:: 1170188
Specimen Quality: ADEQUATE

## 2019-09-01 NOTE — Progress Notes (Signed)
HPI: [name] is a 28 y.o. female who  has a past medical history of Anxiety, Depression, Family history of alcohol abuse (12/14/2015), H/O cold sores (12/14/2015), and Implanon in place (12/14/2015).  she presents to [facility] today, 09/01/19,  for chief complaint of:  STD test   Has a new partner, routine screening requested, no known exposure.  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  No history of IV drug use   At today's visit 09/01/19 ... PMH, PSH, FH reviewed and updated as needed.  Current medication list and allergy/intolerance hx reviewed and updated as needed. (See remainder of HPI, ROS, Phys Exam below)   No results found.  No results found for this or any previous visit (from the past 72 hour(s)).        ASSESSMENT/PLAN: The encounter diagnosis was Routine screening for STI (sexually transmitted infection).   Orders Placed This Encounter  Procedures  . C. trachomatis/N. gonorrhoeae RNA  . WET PREP FOR [place], YEAST, CLUE  . Hepatitis B core antibody, total  . Hepatitis B surface antibody  . Hepatitis B surface antigen  . HIV antibody  . RPR  . Hepatitis C Antibody       Follow-up plan: Return for RECHECK PENDING STI RESULTS.                                                 ################################################# ################################################# ################################################# #################################################    Current Meds  Medication Sig  . acyclovir (ZOVIRAX) 400 MG tablet TAKE 1 TABLET BY MOUTH 5 TIMES DAILY FOR 5 DAYS START AT 1ST SIGN OF COLD SORE  . ALPRAZolam (XANAX) 0.5 MG tablet TAKE 1/2 TO 1 TABLET BY MOUTH 2 TIMES A DAY AS NEEDED FOR ANXIETY  . sertraline (ZOLOFT) 100 MG tablet Take 1 tablet (100 mg total) by mouth daily.    Allergies  Allergen  Reactions  . Sulfa Antibiotics Hives and Rash       Review of Systems:  Constitutional: No recent illness  Musculoskeletal: No new myalgia/arthralgia  Skin: No  Rash  Hem/Onc: No  easy bruising/bleeding, No  abnormal lumps/bumps   Exam:  BP 120/77 (BP Location: Left Arm, Patient Position: Sitting, Cuff Size: Normal)   Pulse 79   Temp 98.2 F (36.8 C) (Oral)   Wt 192 lb 1.9 oz (87.1 kg)   BMI 32.98 kg/m   Constitutional: VS see above. General Appearance: alert, well-developed, well-nourished, NAD  Neck: No masses, trachea midline.   Respiratory: Normal respiratory effort.   Musculoskeletal: Gait normal.   Neurological: Normal balance/coordination. No tremor.  Skin: warm, dry, intact.  Psychiatric: Normal judgment/insight. Normal mood and affect. Oriented x3.   GYN: No lesions/ulcers to external genitalia, normal urethra, normal vaginal mucosa, physiologic discharge, cervix normal without lesions      Visit summary with medication list and pertinent instructions was printed for patient to review, patient was advised to alert [place] if any updates are needed. All questions at time of visit were answered - patient instructed to contact office with any additional concerns. ER/RTC precautions were reviewed with the patient and understanding verbalized.   Note: Total time spent 15 minutes, greater than 50% of the visit was spent face-to-face counseling and coordinating care for the following: The encounter diagnosis was Routine screening for  STI (sexually transmitted infection)..  Please note: voice recognition software was used to produce this document, and typos may escape review. Please contact Dr. Lyn Hollingshead for any needed clarifications.    Follow up plan: Return for RECHECK PENDING STI RESULTS.

## 2019-09-02 ENCOUNTER — Encounter: Payer: Self-pay | Admitting: Osteopathic Medicine

## 2019-09-02 LAB — HEPATITIS B SURFACE ANTIGEN: Hepatitis B Surface Ag: NONREACTIVE

## 2019-09-02 LAB — HEPATITIS B SURFACE ANTIBODY,QUALITATIVE: Hep B S Ab: REACTIVE — AB

## 2019-09-02 LAB — C. TRACHOMATIS/N. GONORRHOEAE RNA
C. trachomatis RNA, TMA: NOT DETECTED
N. gonorrhoeae RNA, TMA: NOT DETECTED

## 2019-09-02 LAB — RPR: RPR Ser Ql: NONREACTIVE

## 2019-09-02 LAB — HEPATITIS B CORE ANTIBODY, TOTAL: Hep B Core Total Ab: NONREACTIVE

## 2019-09-02 LAB — HIV ANTIBODY (ROUTINE TESTING W REFLEX): HIV 1&2 Ab, 4th Generation: NONREACTIVE

## 2019-09-02 LAB — HEPATITIS C ANTIBODY
Hepatitis C Ab: NONREACTIVE
SIGNAL TO CUT-OFF: 0.01 (ref <1.00)

## 2019-09-05 ENCOUNTER — Other Ambulatory Visit: Payer: Self-pay

## 2019-09-05 DIAGNOSIS — Z20822 Contact with and (suspected) exposure to covid-19: Secondary | ICD-10-CM

## 2019-09-06 LAB — NOVEL CORONAVIRUS, NAA: SARS-CoV-2, NAA: NOT DETECTED

## 2019-09-11 ENCOUNTER — Other Ambulatory Visit: Payer: Self-pay | Admitting: Osteopathic Medicine

## 2019-09-11 ENCOUNTER — Ambulatory Visit: Payer: 59 | Admitting: Osteopathic Medicine

## 2019-10-07 ENCOUNTER — Other Ambulatory Visit: Payer: Self-pay | Admitting: Osteopathic Medicine

## 2019-10-07 NOTE — Telephone Encounter (Signed)
Please review for refill- patient at [facility] 

## 2019-10-08 ENCOUNTER — Encounter: Payer: Self-pay | Admitting: Osteopathic Medicine

## 2019-10-10 ENCOUNTER — Other Ambulatory Visit: Payer: Self-pay

## 2019-10-10 ENCOUNTER — Ambulatory Visit (INDEPENDENT_AMBULATORY_CARE_PROVIDER_SITE_OTHER): Payer: 59 | Admitting: Osteopathic Medicine

## 2019-10-10 ENCOUNTER — Encounter: Payer: Self-pay | Admitting: Osteopathic Medicine

## 2019-10-10 VITALS — BP 128/87 | HR 71 | Temp 97.5°F | Wt 191.1 lb

## 2019-10-10 DIAGNOSIS — F1090 Alcohol use, unspecified, uncomplicated: Secondary | ICD-10-CM

## 2019-10-10 DIAGNOSIS — E538 Deficiency of other specified B group vitamins: Secondary | ICD-10-CM

## 2019-10-10 DIAGNOSIS — R519 Headache, unspecified: Secondary | ICD-10-CM | POA: Diagnosis not present

## 2019-10-10 DIAGNOSIS — Z7289 Other problems related to lifestyle: Secondary | ICD-10-CM | POA: Diagnosis not present

## 2019-10-10 DIAGNOSIS — Z789 Other specified health status: Secondary | ICD-10-CM

## 2019-10-10 DIAGNOSIS — Z113 Encounter for screening for infections with a predominantly sexual mode of transmission: Secondary | ICD-10-CM | POA: Diagnosis not present

## 2019-10-10 DIAGNOSIS — R5382 Chronic fatigue, unspecified: Secondary | ICD-10-CM | POA: Diagnosis not present

## 2019-10-10 NOTE — Progress Notes (Signed)
[name] is a 29 y.o. female who presents to  [facility]  today, 10/10/19, seeking care for the following:  The primary encounter diagnosis was Temporal headache. Diagnoses of Routine screening for STI (sexually transmitted infection), Chronic fatigue, Alcohol use, and B12 deficiency were also pertinent to this visit.    ASSESSMENT & PLAN with other pertinent history/findings:  1. Temporal headache No significant s/s temporal arteritis but location is concerning. ESR and CRP both WNL and not meeting other criteria. ER precautions reviewed. Consider imaging if headache persists  2. Routine screening for STI (sexually transmitted infection) HIV negative pt declined other testing   3. Chronic fatigue Labs as below  4. Alcohol use EtOH overuse, 1/5 bottles wine per day most days. (+)FH EtOH. Pt has good insight into the issue, does not feel ready to quit, liver enzymes ok.   5. B12 deficiency Levels WNL but on the low side ok to supplement 1000-2000 mcg daily       Orders Placed This Encounter  Procedures  . CBC  . COMPLETE METABOLIC PANEL WITH GFR  . TSH  . Vitamin B12  . Sedimentation rate  . High sensitivity CRP  . HIV antibody    No orders of the defined types were placed in this encounter.   Patient Instructions  Plan:  We will go ahead and get some blood work to evaluate for potential causes of headache, fatigue, and also to screen for HIV.    If the inflammatory markers (called ESR and CRP) are significantly elevated, in light of the headache location this would concern [place] for something called temporal arteritis and we will probably need to get some scans to evaluate for that condition.  Otherwise, I have sent an anti-inflammatory medication into the pharmacy for you to take for the next 5 to 7 days to see if this resolves the headache, which is most likely a tension headache.   Work on cutting  back on alcohol use.  Safe alcohol intake for most females is 1 drink per day which would be 1 shot of liquor, 1 glass of wine, 1 bottle of beer.  If you decide you would like to seek treatment, medication/therapy, please let me know this is something that I can arrange for you or I can even start the medications myself.   I will be in touch once I have reviewed your lab results, I do keep an eye on these things over the weekend.  If headache gets dramatically worse, especially if vision changes or jaw pain develop, please go to the emergency room!       Follow-up instructions: Return for RECHECK PENDING RESULTS / IF WORSE OR CHANGE.        BP 128/87 (BP Location: Left Arm, Patient Position: Sitting, Cuff Size: Normal)   Pulse 71   Temp (!) 97.5 F (36.4 C) (Oral)   Wt 191 lb 1.9 oz (86.7 kg)   BMI 32.81 kg/m   Current Meds  Medication Sig  . acyclovir (ZOVIRAX) 400 MG tablet TAKE 1 TABLET BY MOUTH 5 TIMES DAILY FOR 5 DAYS START AT 1ST SIGN OF COLD SORE  . ALPRAZolam (XANAX) 0.5 MG tablet TAKE 1/2 TO 1 TABLET BY MOUTH 2 TIMES A DAY AS NEEDED FOR ANXIETY  . sertraline (ZOLOFT) 100 MG tablet Take 1 tablet (100 mg total) by mouth daily.    Results for orders placed or performed in visit on 10/10/19 (from the  past 72 hour(s))  CBC     Status: Abnormal   Collection Time: 10/10/19  9:34 AM  Result Value Ref Range   WBC 7.5 3.8 - 10.8 Thousand/uL   RBC 4.83 3.80 - 5.10 Million/uL   Hemoglobin 15.5 11.7 - 15.5 g/dL   HCT 46.6 (H) 35.0 - 45.0 %   MCV 96.5 80.0 - 100.0 fL   MCH 32.1 27.0 - 33.0 pg   MCHC 33.3 32.0 - 36.0 g/dL   RDW 12.3 11.0 - 15.0 %   Platelets 322 140 - 400 Thousand/uL   MPV 9.6 7.5 - 12.5 fL  COMPLETE METABOLIC PANEL WITH GFR     Status: Abnormal   Collection Time: 10/10/19  9:34 AM  Result Value Ref Range   Glucose, Bld 94 65 - 99 mg/dL    Comment: .            Fasting reference interval .    BUN 11 7 - 25 mg/dL   Creat 0.65 0.50 - 1.10 mg/dL   GFR,  Est Non African American 119 > OR = 60 mL/min/1.49m   GFR, Est African American 138 > OR = 60 mL/min/1.746m  BUN/Creatinine Ratio NOT APPLICABLE 6 - 22 (calc)   Sodium 140 135 - 146 mmol/L   Potassium 4.3 3.5 - 5.3 mmol/L   Chloride 109 98 - 110 mmol/L   CO2 24 20 - 32 mmol/L   Calcium 9.8 8.6 - 10.2 mg/dL   Total Protein 6.8 6.1 - 8.1 g/dL   Albumin 4.3 3.6 - 5.1 g/dL   Globulin 2.5 1.9 - 3.7 g/dL (calc)   AG Ratio 1.7 1.0 - 2.5 (calc)   Total Bilirubin 0.6 0.2 - 1.2 mg/dL   Alkaline phosphatase (APISO) 94 31 - 125 U/L   AST 18 10 - 30 U/L   ALT 38 (H) 6 - 29 U/L  Vitamin B12     Status: None   Collection Time: 10/10/19  9:34 AM  Result Value Ref Range   Vitamin B-12 374 200 - 1,100 pg/mL    Comment: . Please Note: Although the reference range for vitamin B12 is [phone] pg/mL, it has been reported that between 5 and 10% of patients with values between 200 and 400 pg/mL may experience neuropsychiatric and hematologic abnormalities due to occult B12 deficiency; less than 1% of patients with values above 400 pg/mL will have symptoms. .   Sedimentation rate     Status: None   Collection Time: 10/10/19  9:34 AM  Result Value Ref Range   Sed Rate CANCELED     Comment: TEST NOT PERFORMED . Initial testing necessitated a repeat, but there was insufficient sample to perform repeat.  Result canceled by the ancillary.   High sensitivity CRP     Status: None   Collection Time: 10/10/19  9:34 AM  Result Value Ref Range   hs-CRP 2.9 mg/L    Comment: Reference Range Optimal <1.0 Jellinger PS et al. EnPeterson Lombardract.2017;23(Suppl 2):1-87. . For ages >1>19ears: hs-CRP mg/L  Risk According to AHA/CDC Guidelines <1.0         Lower relative cardiovascular risk. 1.0-3.0      Average relative cardiovascular risk. 3.1-10.0     Higher relative cardiovascular risk.              Consider retesting in 1 to 2 weeks to              exclude a benign transient elevation  in the  baseline CRP value secondary              to infection or inflammation. >10.0        Persistent elevation, upon retesting,              may be associated with infection and              inflammation. Marland Kitchen   HIV antibody     Status: None   Collection Time: 10/10/19  9:34 AM  Result Value Ref Range   HIV 1&2 Ab, 4th Generation NON-REACTIVE NON-REACTI    Comment: HIV-1 antigen and HIV-1/HIV-2 antibodies were not detected. There is no laboratory evidence of HIV infection. Marland Kitchen PLEASE NOTE: This information has been disclosed to you from records whose confidentiality may be protected by state law.  If your state requires such protection, then the state law prohibits you from making any further disclosure of the information without the specific written consent of the person to whom it pertains, or as otherwise permitted by law. A general authorization for the release of medical or other information is NOT sufficient for this purpose. . For additional information please refer to http://education.questdiagnostics.com/faq/FAQ106 (This link is being provided for informational/ educational purposes only.) . Marland Kitchen The performance of this assay has not been clinically validated in patients less than 36 years old. .     No results found.  Depression screen [facility] 2/9 04/01/2019 03/01/2017  Decreased Interest 1 1  Down, Depressed, Hopeless 1 1  PHQ - 2 Score 2 2  Altered sleeping 1 1  Tired, decreased energy 1 1  Change in appetite 0 2  Feeling bad or failure about yourself  0 2  Trouble concentrating 0 2  Moving slowly or fidgety/restless 0 2  Suicidal thoughts 0 1  PHQ-9 Score 4 13  Difficult doing work/chores Somewhat difficult -    GAD 7 : Generalized Anxiety Score 04/01/2019 03/01/2017  Nervous, Anxious, on Edge 2 2  Control/stop worrying 2 3  Worry too much - different things 2 3  Trouble relaxing 2 2  Restless 2 2  Easily annoyed or irritable 2 3  Afraid - awful might happen 2 2   Total GAD 7 Score 14 17  Anxiety Difficulty Somewhat difficult -       [email or web address]

## 2019-10-10 NOTE — Patient Instructions (Addendum)
Plan:  We will go ahead and get some blood work to evaluate for potential causes of headache, fatigue, and also to screen for HIV.    If the inflammatory markers (called ESR and CRP) are significantly elevated, in light of the headache location this would concern [place] for something called temporal arteritis and we will probably need to get some scans to evaluate for that condition.  Otherwise, I have sent an anti-inflammatory medication into the pharmacy for you to take for the next 5 to 7 days to see if this resolves the headache, which is most likely a tension headache.   Work on cutting back on alcohol use.  Safe alcohol intake for most females is 1 drink per day which would be 1 shot of liquor, 1 glass of wine, 1 bottle of beer.  If you decide you would like to seek treatment, medication/therapy, please let me know this is something that I can arrange for you or I can even start the medications myself.   I will be in touch once I have reviewed your lab results, I do keep an eye on these things over the weekend.  If headache gets dramatically worse, especially if vision changes or jaw pain develop, please go to the emergency room!

## 2019-10-12 LAB — COMPLETE METABOLIC PANEL WITH GFR
AG Ratio: 1.7 (calc) (ref 1.0–2.5)
ALT: 38 U/L — ABNORMAL HIGH (ref 6–29)
AST: 18 U/L (ref 10–30)
Albumin: 4.3 g/dL (ref 3.6–5.1)
Alkaline phosphatase (APISO): 94 U/L (ref 31–125)
BUN: 11 mg/dL (ref 7–25)
CO2: 24 mmol/L (ref 20–32)
Calcium: 9.8 mg/dL (ref 8.6–10.2)
Chloride: 109 mmol/L (ref 98–110)
Creat: 0.65 mg/dL (ref 0.50–1.10)
GFR, Est African American: 138 mL/min/{1.73_m2} (ref >=60)
GFR, Est Non African American: 119 mL/min/{1.73_m2} (ref >=60)
Globulin: 2.5 g/dL (calc) (ref 1.9–3.7)
Glucose, Bld: 94 mg/dL (ref 65–99)
Potassium: 4.3 mmol/L (ref 3.5–5.3)
Sodium: 140 mmol/L (ref 135–146)
Total Bilirubin: 0.6 mg/dL (ref 0.2–1.2)
Total Protein: 6.8 g/dL (ref 6.1–8.1)

## 2019-10-12 LAB — CBC
HCT: 46.6 % — ABNORMAL HIGH (ref 35.0–45.0)
Hemoglobin: 15.5 g/dL (ref 11.7–15.5)
MCH: 32.1 pg (ref 27.0–33.0)
MCHC: 33.3 g/dL (ref 32.0–36.0)
MCV: 96.5 fL (ref 80.0–100.0)
MPV: 9.6 fL (ref 7.5–12.5)
Platelets: 322 10*3/uL (ref 140–400)
RBC: 4.83 10*6/uL (ref 3.80–5.10)
RDW: 12.3 % (ref 11.0–15.0)
WBC: 7.5 10*3/uL (ref 3.8–10.8)

## 2019-10-12 LAB — VITAMIN B12: Vitamin B-12: 374 pg/mL (ref 200–1100)

## 2019-10-12 LAB — TSH: TSH: 1.67 mIU/L

## 2019-10-12 LAB — HIGH SENSITIVITY CRP: hs-CRP: 2.9 mg/L

## 2019-10-12 LAB — SEDIMENTATION RATE

## 2019-10-12 LAB — HIV ANTIBODY (ROUTINE TESTING W REFLEX): HIV 1&2 Ab, 4th Generation: NONREACTIVE

## 2019-10-15 ENCOUNTER — Encounter: Payer: Self-pay | Admitting: Osteopathic Medicine

## 2019-10-16 MED ORDER — NAPROXEN 500 MG PO TABS: 500.0000 mg | ORAL_TABLET | Freq: Two times a day (BID) | ORAL | 1 refills | Status: DC

## 2019-10-24 ENCOUNTER — Other Ambulatory Visit: Payer: Self-pay | Admitting: Osteopathic Medicine

## 2019-10-27 ENCOUNTER — Other Ambulatory Visit: Payer: Self-pay | Admitting: Osteopathic Medicine

## 2019-10-27 MED ORDER — ALPRAZOLAM 0.5 MG PO TABS: 0.2500 mg | ORAL_TABLET | Freq: Two times a day (BID) | ORAL | 0 refills | Status: DC | PRN

## 2019-10-27 NOTE — Telephone Encounter (Signed)
Please call patinet: I received a refill request for Xanax pretty shortly after her last refill I will be sending 30 pills but she needs to make this last 90 days If need to discuss maintenance medications for anxiety, please schedule appt!

## 2019-10-28 ENCOUNTER — Encounter: Payer: Self-pay | Admitting: Osteopathic Medicine

## 2019-10-28 NOTE — Telephone Encounter (Signed)
Pt has been updated and is aware of provider's note. Pt was agreeable with plan. No other inquiries during call.

## 2019-11-13 ENCOUNTER — Encounter: Payer: Self-pay | Admitting: Osteopathic Medicine

## 2019-11-13 ENCOUNTER — Telehealth (INDEPENDENT_AMBULATORY_CARE_PROVIDER_SITE_OTHER): Payer: 59 | Admitting: Osteopathic Medicine

## 2019-11-13 DIAGNOSIS — F419 Anxiety disorder, unspecified: Secondary | ICD-10-CM | POA: Diagnosis not present

## 2019-11-13 DIAGNOSIS — F1020 Alcohol dependence, uncomplicated: Secondary | ICD-10-CM | POA: Diagnosis not present

## 2019-11-13 DIAGNOSIS — F329 Major depressive disorder, single episode, unspecified: Secondary | ICD-10-CM | POA: Diagnosis not present

## 2019-11-13 MED ORDER — DIAZEPAM 5 MG PO TABS: ORAL_TABLET | ORAL | 0 refills | Status: AC

## 2019-11-13 NOTE — Progress Notes (Signed)
Virtual Visit via Video (App used: Doximity) Note  I connected with      [name] on 11/13/19 at 8:13 AM  by a telemedicine application and verified that I am speaking with the correct person using two identifiers.  Patient is at work I am in office   I discussed the limitations of evaluation and management by telemedicine and the availability of in person appointments. The patient expressed understanding and agreed to proceed.  History of Present Illness: [name] is a 29 y.o. female who would like to discuss quitting drinking    EtOH:  Would like to stop drinking 1-2 bottles wine per day No hx seizures Has gone 1-2 days without drinking, experienced anxiety and sweating            Observations/Objective: There were no vitals taken for this visit. BP Readings from Last 3 Encounters:  10/10/19 128/87  09/01/19 120/77  04/08/19 139/75   Exam: Normal Speech.  NAD  Lab and Radiology Results No results found for this or any previous visit (from the past 72 hour(s)). No results found.     Assessment and Plan: 29 y.o. female with The primary encounter diagnosis was Uncomplicated alcohol dependence (HCC). A diagnosis of Anxiety and depression was also pertinent to this visit.  Low risk for withdrawal, will Rx benzo taper just in case.  Referral to behavioral health will be helpful Pt would like to think about Naltrexone   PDMP not reviewed this encounter. Orders Placed This Encounter  Procedures  . Ambulatory referral to Behavioral Health    Referral Priority:   Routine    Referral Type:   Psychiatric    Referral Reason:   Specialty Services Required    Referral Location[facility]   [facility]    Requested Specialty:   Behavioral Health    Number of Visits Requested:   1   Meds ordered this encounter  Medications  . diazepam (VALIUM) 5 MG tablet    Sig: Take 1 tablet (5 mg total) by mouth every 6 (six) hours for 1  day, THEN 1 tablet (5 mg total) every 8 (eight) hours for 1 day, THEN 0.5 tablets (2.5 mg total) in the morning and at bedtime for 1 day, THEN 0.5 tablets (2.5 mg total) at bedtime for 2 days.    Dispense:  9 tablet    Refill:  0   Patient Instructions  Medications: Naltrexone - helps with alcohol cessation, long term medicine - think about this!  Valium - short term prevents withdrawal symptoms - Rx sent!   Counseling:  I placed referral!  See below for reasons to seek emergency medical care   Will touch base soon!     Alcohol Withdrawal Syndrome Alcohol withdrawal syndrome is a group of symptoms that can develop when a person who drinks heavily and regularly stops drinking or drinks less. Alcohol withdrawal syndrome can be mild or severe, and it may even be life-threatening. Alcohol withdrawal syndrome usually affects people who have alcohol use disorder, which may also be called alcoholism. Alcohol use disorder is when a person is unable to control his or her alcohol use, and drinking too much or too often causes problems at home, at work, or in relationships. What are the causes? Drinking heavily and drinking on a regular basis cause changes in brain chemistry. Over time, the body becomes dependent on alcohol. When alcohol use stops, the chemistry system in the brain becomes unbalanced and causes the  symptoms of alcohol withdrawal. What increases the risk? Alcohol withdrawal syndrome is more likely to occur in people who drink more than the recommended limit of alcohol (2 drinks a day for men or 1 drink a day for non-pregnant women). It is also more likely to affect heavy drinkers who have been using alcohol for long periods of time. The more a person drinks and the longer he or she drinks, the greater the risk of alcohol withdrawal syndrome. Severe withdrawal is more likely to develop in someone who:  Had severe alcohol withdrawal in the past.  Had a seizure during a previous  episode of alcohol withdrawal.  Is elderly.  Uses other drugs.  Has a long-term (chronic) medical problem, such as heart, lung, or liver disease.  Has depression.  Does not get enough nutrients from his or her diet (malnutrition). What are the signs or symptoms? Symptoms of this condition can be mild to moderate, or they can be severe. Symptoms may develop a few hours (or up to a day) after a person changes his or her drinking patterns. During the 48 hours after he or she has stopped drinking, the following symptoms may go away or get better:  Uncontrollable shaking (tremor).  Sweating.  Headache.  Anxiety.  Inability to relax (agitation).  Trouble sleeping (insomnia).  Irregular heartbeats (palpitations).  Alcohol cravings.  Seizure. The following symptoms may get worse 24-48 hours after a person has decreased or stopped alcohol use, and they may gradually improve over a period of days or weeks:  Nausea and vomiting.  Fatigue.  Sensitivity to light and sounds.  Confusion and inability to think clearly.  Loss of appetite.  Mood swings, irritability, depression, and anxiety.  Insomnia and nightmares. The following symptoms are severe and life-threatening. When these symptoms occur together, they are called delirium tremens (DTs):  High blood pressure.  Increased heart rate.  Trouble breathing.  Seizures. These may go away along with other symptoms, or they may persist.  Seeing, hearing, feeling, smelling, or tasting things that are not there (hallucinations). If you experience hallucinations, they usually begin 12-24 hours after a change in drinking patterns. Delirium tremens requires immediate hospitalization. How is this diagnosed? This condition may be diagnosed based on:  Your symptoms and medical history.  Your history of alcohol use. Your health care provider may ask questions about your drinking behavior. It is important to be honest when you  answer these questions.  A psychological assessment.  A physical exam.  Blood tests or urine tests to measure blood alcohol level and to rule out other causes of symptoms.  MRI or CT scan. This may be done if you seem to have abnormal thinking or behaviors (altered mental status). Diagnosis can be difficult. People going through withdrawal often avoid seeking medical care and are not thinking clearly. Friends and family members play an important role in recognizing symptoms and encouraging loved ones to get treatment. How is this treated? Most people with symptoms of withdrawal can be treated outside of a hospital setting (outpatient treatment), with close monitoring such as daily check-ins with a health care provider and counseling. You may need treatment at a hospital or treatment center (inpatient treatment) if:  You have a history of delirium tremens or seizures.  You have severe symptoms.  You are addicted to other drugs.  You cannot swallow medicine.  You have a serious medical condition such as heart failure.  You experienced withdrawal in the past but then you continued drinking  alcohol.  You are not likely to commit to an outpatient treatment schedule. Treatment may involve:  Monitoring your blood pressure, pulse, and breathing.  IV fluids to keep you hydrated.  Medicines to reduce withdrawal symptoms and discomfort (benzodiazepines).  Medicine to reduce anxiety.  Medicine to prevent or control seizures.  Multivitamins and B vitamins.  Having a health care provider check on you daily. It is important to get treatment for alcohol withdrawal early. Getting treatment early can:  Speed up your recovery from withdrawal symptoms.  Make you more successful with long-term stoppage of alcohol use (sobriety). If you need help to stop drinking, your health care provider may recommend a long-term treatment plan that includes:  Medicines to help treat alcohol use  disorder.  Substance abuse counseling.  Support groups. Follow these instructions at home:  Take over-the-counter and prescription medicines (including vitamin supplements) only as told by your health care provider.  Do not drink alcohol.  Do not drive until your health care provider approves.  Have someone you trust stay with you or be available if you need help with your symptoms or with not drinking.  Drink enough fluid to keep your urine pale yellow.  Consider joining an alcohol support group or treatment program. These can provide emotional support, advice, and guidance.  Keep all follow-up visits as told by your health care provider. This is important. Get help right away if:  You have an irregular heartbeat.  You have chest pain.  You have trouble breathing.  You have a seizure for the first time.  You hallucinate.  You become very confused. Summary  Alcohol withdrawal is a group of symptoms that can develop when a person who drinks heavily and regularly stops drinking or drinks less.  Symptoms of this condition can be mild to moderate, or they can be severe.  Treatment may include hospitalization, medicine, and counseling. This information is not intended to replace advice given to you by your health care provider. Make sure you discuss any questions you have with your health care provider. Document Revised: 08/24/2017 Document Reviewed: 05/18/2017 Elsevier Patient Education  2020 [employer].    Instructions sent via MyChart. If MyChart not available, pt was given option for info via personal e-mail w/ no guarantee of protected health info over unsecured e-mail communication, and MyChart sign-up instructions were sent to patient.   Follow Up Instructions: Return for KEEP CURRENTLY SCHEDULED APPOINTMENT: Monday 2/22 at 2:00 , VIRTUAL VISIT.    I discussed the assessment and treatment plan with the patient. The patient was provided an opportunity to ask  questions and all were answered. The patient agreed with the plan and demonstrated an understanding of the instructions.   The patient was advised to call back or seek an in-person evaluation if any new concerns, if symptoms worsen or if the condition fails to improve as anticipated.  30 minutes of non-face-to-face time was provided during this encounter.      . . . . . . . . . . . . . Marland Kitchen                   Historical information moved to improve visibility of documentation.  Past Medical History:  Diagnosis Date  . Anxiety   . Depression   . Family history of alcohol abuse 12/14/2015  . 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 Implanon in place 12/14/2015   06/2015     Past Surgical History:  Procedure Laterality Date  .  BACK SURGERY     Social History   Tobacco Use  . Smoking status: Current Every Day Smoker    Packs/day: 0.50  . Smokeless tobacco: Never Used  Substance Use Topics  . Alcohol use: Yes    Comment: socially   family history includes Alcohol abuse in her father; Cancer in her maternal grandfather and paternal grandmother; Depression in her maternal grandmother and mother; Hypertension in her maternal uncle.  Medications: Current Outpatient Medications  Medication Sig Dispense Refill  . acyclovir (ZOVIRAX) 400 MG tablet TAKE 1 TABLET BY MOUTH 5 TIMES DAILY FOR 5 DAYS START AT 1ST SIGN OF COLD SORE 50 tablet 1  . ALPRAZolam (XANAX) 0.5 MG tablet Take 0.5-1 tablets (0.25-0.5 mg total) by mouth 2 (two) times daily as needed for anxiety (SPARING USE: #30 MUST LAST 90 DAYS). 30 tablet 0  . diazepam (VALIUM) 5 MG tablet Take 1 tablet (5 mg total) by mouth every 6 (six) hours for 1 day, THEN 1 tablet (5 mg total) every 8 (eight) hours for 1 day, THEN 0.5 tablets (2.5 mg total) in the morning and at bedtime for 1 day, THEN 0.5 tablets (2.5 mg total) at bedtime for 2 days. 9 tablet 0  . naproxen (NAPROSYN) 500 MG tablet Take 1 tablet (500 mg total) by  mouth 2 (two) times daily with a meal. Take every day for one week, then use as needed after that 60 tablet 1  . sertraline (ZOLOFT) 100 MG tablet Take 1 tablet (100 mg total) by mouth daily. 90 tablet 3   No current facility-administered medications for this visit.   Allergies  Allergen Reactions  . Sulfa Antibiotics Hives and Rash

## 2019-11-13 NOTE — Patient Instructions (Addendum)
Medications: Naltrexone - helps with alcohol cessation, long term medicine - think about this!  Valium - short term prevents withdrawal symptoms - Rx sent!   Counseling:  I placed referral!  See below for reasons to seek emergency medical care   Will touch base soon!     Alcohol Withdrawal Syndrome Alcohol withdrawal syndrome is a group of symptoms that can develop when a person who drinks heavily and regularly stops drinking or drinks less. Alcohol withdrawal syndrome can be mild or severe, and it may even be life-threatening. Alcohol withdrawal syndrome usually affects people who have alcohol use disorder, which may also be called alcoholism. Alcohol use disorder is when a person is unable to control his or her alcohol use, and drinking too much or too often causes problems at home, at work, or in relationships. What are the causes? Drinking heavily and drinking on a regular basis cause changes in brain chemistry. Over time, the body becomes dependent on alcohol. When alcohol use stops, the chemistry system in the brain becomes unbalanced and causes the symptoms of alcohol withdrawal. What increases the risk? Alcohol withdrawal syndrome is more likely to occur in people who drink more than the recommended limit of alcohol (2 drinks a day for men or 1 drink a day for non-pregnant women). It is also more likely to affect heavy drinkers who have been using alcohol for long periods of time. The more a person drinks and the longer he or she drinks, the greater the risk of alcohol withdrawal syndrome. Severe withdrawal is more likely to develop in someone who:  Had severe alcohol withdrawal in the past.  Had a seizure during a previous episode of alcohol withdrawal.  Is elderly.  Uses other drugs.  Has a long-term (chronic) medical problem, such as heart, lung, or liver disease.  Has depression.  Does not get enough nutrients from his or her diet (malnutrition). What are the signs or  symptoms? Symptoms of this condition can be mild to moderate, or they can be severe. Symptoms may develop a few hours (or up to a day) after a person changes his or her drinking patterns. During the 48 hours after he or she has stopped drinking, the following symptoms may go away or get better:  Uncontrollable shaking (tremor).  Sweating.  Headache.  Anxiety.  Inability to relax (agitation).  Trouble sleeping (insomnia).  Irregular heartbeats (palpitations).  Alcohol cravings.  Seizure. The following symptoms may get worse 24-48 hours after a person has decreased or stopped alcohol use, and they may gradually improve over a period of days or weeks:  Nausea and vomiting.  Fatigue.  Sensitivity to light and sounds.  Confusion and inability to think clearly.  Loss of appetite.  Mood swings, irritability, depression, and anxiety.  Insomnia and nightmares. The following symptoms are severe and life-threatening. When these symptoms occur together, they are called delirium tremens (DTs):  High blood pressure.  Increased heart rate.  Trouble breathing.  Seizures. These may go away along with other symptoms, or they may persist.  Seeing, hearing, feeling, smelling, or tasting things that are not there (hallucinations). If you experience hallucinations, they usually begin 12-24 hours after a change in drinking patterns. Delirium tremens requires immediate hospitalization. How is this diagnosed? This condition may be diagnosed based on:  Your symptoms and medical history.  Your history of alcohol use. Your health care provider may ask questions about your drinking behavior. It is important to be honest when you answer these questions.  A psychological assessment.  A physical exam.  Blood tests or urine tests to measure blood alcohol level and to rule out other causes of symptoms.  MRI or CT scan. This may be done if you seem to have abnormal thinking or behaviors  (altered mental status). Diagnosis can be difficult. People going through withdrawal often avoid seeking medical care and are not thinking clearly. Friends and family members play an important role in recognizing symptoms and encouraging loved ones to get treatment. How is this treated? Most people with symptoms of withdrawal can be treated outside of a hospital setting (outpatient treatment), with close monitoring such as daily check-ins with a health care provider and counseling. You may need treatment at a hospital or treatment center (inpatient treatment) if:  You have a history of delirium tremens or seizures.  You have severe symptoms.  You are addicted to other drugs.  You cannot swallow medicine.  You have a serious medical condition such as heart failure.  You experienced withdrawal in the past but then you continued drinking alcohol.  You are not likely to commit to an outpatient treatment schedule. Treatment may involve:  Monitoring your blood pressure, pulse, and breathing.  IV fluids to keep you hydrated.  Medicines to reduce withdrawal symptoms and discomfort (benzodiazepines).  Medicine to reduce anxiety.  Medicine to prevent or control seizures.  Multivitamins and B vitamins.  Having a health care provider check on you daily. It is important to get treatment for alcohol withdrawal early. Getting treatment early can:  Speed up your recovery from withdrawal symptoms.  Make you more successful with long-term stoppage of alcohol use (sobriety). If you need help to stop drinking, your health care provider may recommend a long-term treatment plan that includes:  Medicines to help treat alcohol use disorder.  Substance abuse counseling.  Support groups. Follow these instructions at home:  Take over-the-counter and prescription medicines (including vitamin supplements) only as told by your health care provider.  Do not drink alcohol.  Do not drive until your  health care provider approves.  Have someone you trust stay with you or be available if you need help with your symptoms or with not drinking.  Drink enough fluid to keep your urine pale yellow.  Consider joining an alcohol support group or treatment program. These can provide emotional support, advice, and guidance.  Keep all follow-up visits as told by your health care provider. This is important. Get help right away if:  You have an irregular heartbeat.  You have chest pain.  You have trouble breathing.  You have a seizure for the first time.  You hallucinate.  You become very confused. Summary  Alcohol withdrawal is a group of symptoms that can develop when a person who drinks heavily and regularly stops drinking or drinks less.  Symptoms of this condition can be mild to moderate, or they can be severe.  Treatment may include hospitalization, medicine, and counseling. This information is not intended to replace advice given to you by your health care provider. Make sure you discuss any questions you have with your health care provider. Document Revised: 08/24/2017 Document Reviewed: 05/18/2017 Elsevier Patient Education  2020 [employer].

## 2019-11-17 ENCOUNTER — Ambulatory Visit: Payer: 59 | Admitting: Nurse Practitioner

## 2020-01-22 ENCOUNTER — Telehealth: Payer: Self-pay

## 2020-01-22 NOTE — Telephone Encounter (Signed)
Patient called wanting to know how to get tested for Bronchitis. I advised patient there was not a specific test for this and she would need to be evaluated, preferably in person.   Patient states she is currently at the [facility] for evaluation, I advised that is best and to remain there for evaluation.   FYI to PCP

## 2020-01-30 ENCOUNTER — Other Ambulatory Visit: Payer: Self-pay | Admitting: Osteopathic Medicine

## 2020-01-30 NOTE — Telephone Encounter (Signed)
Last filled #30 with no refills on 10/27/2019 Last appt 11/13/2019

## 2020-02-03 ENCOUNTER — Encounter (HOSPITAL_COMMUNITY): Payer: Self-pay | Admitting: Emergency Medicine

## 2020-02-03 ENCOUNTER — Emergency Department (HOSPITAL_COMMUNITY): Payer: 59

## 2020-02-03 ENCOUNTER — Emergency Department (HOSPITAL_COMMUNITY)
Admission: EM | Admit: 2020-02-03 | Discharge: 2020-02-03 | Disposition: A | Discharge status: Home / Self Care | Payer: 59 | Attending: Emergency Medicine | Admitting: Emergency Medicine

## 2020-02-03 ENCOUNTER — Other Ambulatory Visit: Payer: Self-pay

## 2020-02-03 DIAGNOSIS — Y93E1 Activity, personal bathing and showering: Secondary | ICD-10-CM | POA: Insufficient documentation

## 2020-02-03 DIAGNOSIS — Y92012 Bathroom of single-family (private) house as the place of occurrence of the external cause: Secondary | ICD-10-CM | POA: Insufficient documentation

## 2020-02-03 DIAGNOSIS — W182XXA Fall in (into) shower or empty bathtub, initial encounter: Secondary | ICD-10-CM | POA: Diagnosis not present

## 2020-02-03 DIAGNOSIS — Y998 Other external cause status: Secondary | ICD-10-CM | POA: Insufficient documentation

## 2020-02-03 DIAGNOSIS — W19XXXA Unspecified fall, initial encounter: Secondary | ICD-10-CM

## 2020-02-03 DIAGNOSIS — S022XXA Fracture of nasal bones, initial encounter for closed fracture: Secondary | ICD-10-CM | POA: Diagnosis not present

## 2020-02-03 DIAGNOSIS — S01511A Laceration without foreign body of lip, initial encounter: Secondary | ICD-10-CM | POA: Insufficient documentation

## 2020-02-03 DIAGNOSIS — F17210 Nicotine dependence, cigarettes, uncomplicated: Secondary | ICD-10-CM | POA: Diagnosis not present

## 2020-02-03 DIAGNOSIS — S0121XA Laceration without foreign body of nose, initial encounter: Secondary | ICD-10-CM

## 2020-02-03 DIAGNOSIS — Z79899 Other long term (current) drug therapy: Secondary | ICD-10-CM | POA: Insufficient documentation

## 2020-02-03 DIAGNOSIS — S0990XA Unspecified injury of head, initial encounter: Secondary | ICD-10-CM | POA: Diagnosis present

## 2020-02-03 LAB — POC URINE PREG, ED: Preg Test, Ur: NEGATIVE

## 2020-02-03 MED ORDER — LIDOCAINE HCL (PF) 1 % IJ SOLN
5.0000 mL | Freq: Once | INTRAMUSCULAR | Status: AC
  Administered 2020-02-03: 10:00:00 5 mL via INTRADERMAL
  Filled 2020-02-03: qty 30

## 2020-02-03 NOTE — ED Notes (Signed)
Laceration being cleaned and repaired by Dr. Bernette Mayers.
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
## 2020-02-03 NOTE — ED Triage Notes (Signed)
Patient here from home reporting fall last night in shower. Lip and nose lac noted. Bleeding controlled.

## 2020-02-09 ENCOUNTER — Other Ambulatory Visit: Payer: Self-pay | Admitting: Otolaryngology

## 2020-02-10 ENCOUNTER — Encounter (HOSPITAL_BASED_OUTPATIENT_CLINIC_OR_DEPARTMENT_OTHER): Payer: Self-pay | Admitting: Otolaryngology

## 2020-02-10 ENCOUNTER — Other Ambulatory Visit: Payer: Self-pay

## 2020-02-10 ENCOUNTER — Encounter (INDEPENDENT_AMBULATORY_CARE_PROVIDER_SITE_OTHER): Payer: 59 | Admitting: Osteopathic Medicine

## 2020-02-10 DIAGNOSIS — F419 Anxiety disorder, unspecified: Secondary | ICD-10-CM

## 2020-02-10 NOTE — Progress Notes (Signed)
Chart reviewed with Dr. Brock.  Will proceed with surgery as scheduled without any further clearance/testing needed. 

## 2020-02-10 NOTE — Telephone Encounter (Signed)
Routing to provider  

## 2020-02-12 ENCOUNTER — Other Ambulatory Visit (HOSPITAL_COMMUNITY)
Admission: RE | Admit: 2020-02-12 | Discharge: 2020-02-12 | Disposition: A | Discharge status: Home / Self Care | Payer: 59 | Source: Ambulatory Visit | Attending: Otolaryngology | Admitting: Otolaryngology

## 2020-02-12 DIAGNOSIS — Z01812 Encounter for preprocedural laboratory examination: Secondary | ICD-10-CM | POA: Insufficient documentation

## 2020-02-12 DIAGNOSIS — Z20822 Contact with and (suspected) exposure to covid-19: Secondary | ICD-10-CM | POA: Diagnosis not present

## 2020-02-12 LAB — SARS CORONAVIRUS 2 (TAT 6-24 HRS): SARS Coronavirus 2: NEGATIVE

## 2020-02-12 NOTE — Telephone Encounter (Signed)
5 mins spent  

## 2020-02-16 ENCOUNTER — Ambulatory Visit (HOSPITAL_BASED_OUTPATIENT_CLINIC_OR_DEPARTMENT_OTHER): Payer: 59 | Admitting: Anesthesiology

## 2020-02-16 ENCOUNTER — Other Ambulatory Visit: Payer: Self-pay

## 2020-02-16 ENCOUNTER — Ambulatory Visit (HOSPITAL_BASED_OUTPATIENT_CLINIC_OR_DEPARTMENT_OTHER)
Admission: RE | Admit: 2020-02-16 | Discharge: 2020-02-16 | Disposition: A | Discharge status: Home / Self Care | Payer: 59 | Attending: Otolaryngology | Admitting: Otolaryngology

## 2020-02-16 ENCOUNTER — Encounter (HOSPITAL_BASED_OUTPATIENT_CLINIC_OR_DEPARTMENT_OTHER)
Admission: RE | Disposition: A | Discharge status: Home / Self Care | Payer: Self-pay | Source: Home / Self Care | Attending: Otolaryngology

## 2020-02-16 ENCOUNTER — Encounter (HOSPITAL_BASED_OUTPATIENT_CLINIC_OR_DEPARTMENT_OTHER): Payer: Self-pay | Admitting: Otolaryngology

## 2020-02-16 DIAGNOSIS — F419 Anxiety disorder, unspecified: Secondary | ICD-10-CM | POA: Insufficient documentation

## 2020-02-16 DIAGNOSIS — Z79899 Other long term (current) drug therapy: Secondary | ICD-10-CM | POA: Insufficient documentation

## 2020-02-16 DIAGNOSIS — S022XXA Fracture of nasal bones, initial encounter for closed fracture: Secondary | ICD-10-CM | POA: Insufficient documentation

## 2020-02-16 DIAGNOSIS — F17210 Nicotine dependence, cigarettes, uncomplicated: Secondary | ICD-10-CM | POA: Diagnosis not present

## 2020-02-16 DIAGNOSIS — F329 Major depressive disorder, single episode, unspecified: Secondary | ICD-10-CM | POA: Diagnosis not present

## 2020-02-16 DIAGNOSIS — Y93E1 Activity, personal bathing and showering: Secondary | ICD-10-CM | POA: Diagnosis not present

## 2020-02-16 DIAGNOSIS — W182XXA Fall in (into) shower or empty bathtub, initial encounter: Secondary | ICD-10-CM | POA: Insufficient documentation

## 2020-02-16 HISTORY — PX: CLOSED REDUCTION NASAL FRACTURE: SHX5365

## 2020-02-16 LAB — POCT PREGNANCY, URINE: Preg Test, Ur: NEGATIVE

## 2020-02-16 SURGERY — CLOSED REDUCTION, FRACTURE, NASAL BONE
Anesthesia: General | Site: Nose | Laterality: Bilateral

## 2020-02-16 MED ORDER — OXYMETAZOLINE HCL 0.05 % NA SOLN
NASAL | Status: DC | PRN
  Administered 2020-02-16: 1 via TOPICAL

## 2020-02-16 MED ORDER — LIDOCAINE 2% (20 MG/ML) 5 ML SYRINGE
INTRAMUSCULAR | Status: DC | PRN
  Administered 2020-02-16: 80 mg via INTRAVENOUS

## 2020-02-16 MED ORDER — DEXMEDETOMIDINE HCL 200 MCG/2ML IV SOLN
INTRAVENOUS | Status: DC | PRN
  Administered 2020-02-16: 12 ug via INTRAVENOUS

## 2020-02-16 MED ORDER — ACETAMINOPHEN 500 MG PO TABS
1000.0000 mg | ORAL_TABLET | Freq: Once | ORAL | Status: AC
  Administered 2020-02-16: 1000 mg via ORAL

## 2020-02-16 MED ORDER — MIDAZOLAM HCL 5 MG/5ML IJ SOLN
INTRAMUSCULAR | Status: DC | PRN
  Administered 2020-02-16: 2 mg via INTRAVENOUS

## 2020-02-16 MED ORDER — FENTANYL CITRATE (PF) 100 MCG/2ML IJ SOLN
INTRAMUSCULAR | Status: AC
  Filled 2020-02-16: qty 2

## 2020-02-16 MED ORDER — FENTANYL CITRATE (PF) 100 MCG/2ML IJ SOLN
INTRAMUSCULAR | Status: DC | PRN
  Administered 2020-02-16: 100 ug via INTRAVENOUS

## 2020-02-16 MED ORDER — DEXAMETHASONE SODIUM PHOSPHATE 4 MG/ML IJ SOLN
INTRAMUSCULAR | Status: DC | PRN
  Administered 2020-02-16: 10 mg via INTRAVENOUS

## 2020-02-16 MED ORDER — HYDROMORPHONE HCL 1 MG/ML IJ SOLN: 0.2500 mg | INTRAMUSCULAR | Status: DC | PRN

## 2020-02-16 MED ORDER — PROMETHAZINE HCL 25 MG/ML IJ SOLN: 6.2500 mg | INTRAMUSCULAR | Status: DC | PRN

## 2020-02-16 MED ORDER — PROPOFOL 10 MG/ML IV BOLUS
INTRAVENOUS | Status: DC | PRN
  Administered 2020-02-16: 200 mg via INTRAVENOUS

## 2020-02-16 MED ORDER — OXYCODONE HCL 5 MG/5ML PO SOLN: 5.0000 mg | Freq: Once | ORAL | Status: DC | PRN

## 2020-02-16 MED ORDER — ONDANSETRON HCL 4 MG/2ML IJ SOLN
INTRAMUSCULAR | Status: DC | PRN
  Administered 2020-02-16: 4 mg via INTRAVENOUS

## 2020-02-16 MED ORDER — OXYCODONE HCL 5 MG PO TABS: 5.0000 mg | ORAL_TABLET | Freq: Once | ORAL | Status: DC | PRN

## 2020-02-16 MED ORDER — LACTATED RINGERS IV SOLN: INTRAVENOUS | Status: DC

## 2020-02-16 MED ORDER — ACETAMINOPHEN 500 MG PO TABS
ORAL_TABLET | ORAL | Status: AC
  Filled 2020-02-16: qty 2

## 2020-02-16 MED ORDER — MIDAZOLAM HCL 2 MG/2ML IJ SOLN
INTRAMUSCULAR | Status: AC
  Filled 2020-02-16: qty 2

## 2020-02-16 MED ORDER — KETOROLAC TROMETHAMINE 30 MG/ML IJ SOLN: 30.0000 mg | Freq: Once | INTRAMUSCULAR | Status: DC | PRN

## 2020-02-16 SURGICAL SUPPLY — 33 items
APL SKNCLS STERI-STRIP NONHPOA (GAUZE/BANDAGES/DRESSINGS) ×1
BENZOIN TINCTURE PRP APPL 2/3 (GAUZE/BANDAGES/DRESSINGS) ×3 IMPLANT
CANISTER SUCT 1200ML W/VALVE (MISCELLANEOUS) ×3 IMPLANT
CLOSURE WOUND 1/2 X4 (GAUZE/BANDAGES/DRESSINGS) ×1
CLOSURE WOUND 1/4X4 (GAUZE/BANDAGES/DRESSINGS)
CNTNR URN SCR LID CUP LEK RST (MISCELLANEOUS) ×1 IMPLANT
CONT SPEC 4OZ STRL OR WHT (MISCELLANEOUS) ×3
COVER BACK TABLE 60X90IN (DRAPES) ×1 IMPLANT
COVER MAYO STAND STRL (DRAPES) ×3 IMPLANT
DECANTER SPIKE VIAL GLASS SM (MISCELLANEOUS) IMPLANT
DEPRESSOR TONGUE BLADE STERILE (MISCELLANEOUS) IMPLANT
DRSG CURAD 3X16 NADH (PACKING) IMPLANT
DRSG TELFA 3X8 NADH (GAUZE/BANDAGES/DRESSINGS) IMPLANT
GAUZE PACKING IODOFORM 1/2 (PACKING) IMPLANT
GAUZE SPONGE 4X4 12PLY STRL LF (GAUZE/BANDAGES/DRESSINGS) ×3 IMPLANT
GLOVE BIO SURGEON STRL SZ7.5 (GLOVE) ×3 IMPLANT
NDL PRECISIONGLIDE 27X1.5 (NEEDLE) IMPLANT
NEEDLE PRECISIONGLIDE 27X1.5 (NEEDLE) IMPLANT
PAD DRESSING TELFA 3X8 NADH (GAUZE/BANDAGES/DRESSINGS) IMPLANT
PATTIES SURGICAL .5 X3 (DISPOSABLE) ×3 IMPLANT
SHEET MEDIUM DRAPE 40X70 STRL (DRAPES) ×3 IMPLANT
SHEET SILICONE 2X3 0.03 REINF (MISCELLANEOUS) IMPLANT
SPLINT NASAL THERMO PLAST (MISCELLANEOUS) ×3 IMPLANT
SPONGE GAUZE 2X2 8PLY STER LF (GAUZE/BANDAGES/DRESSINGS)
SPONGE GAUZE 2X2 8PLY STRL LF (GAUZE/BANDAGES/DRESSINGS) IMPLANT
STRIP CLOSURE SKIN 1/2X4 (GAUZE/BANDAGES/DRESSINGS) ×2 IMPLANT
STRIP CLOSURE SKIN 1/4X4 (GAUZE/BANDAGES/DRESSINGS) IMPLANT
SUT ETHILON 3 0 PS 1 (SUTURE) IMPLANT
SYR CONTROL 10ML LL (SYRINGE) IMPLANT
TOWEL GREEN STERILE FF (TOWEL DISPOSABLE) ×3 IMPLANT
TUBE CONNECTING 20'X1/4 (TUBING) ×1
TUBE CONNECTING 20X1/4 (TUBING) ×2 IMPLANT
YANKAUER SUCT BULB TIP NO VENT (SUCTIONS) IMPLANT

## 2020-02-16 NOTE — Anesthesia Preprocedure Evaluation (Addendum)
Anesthesia Evaluation  Patient identified by MRN, date of birth, ID band Patient awake    Reviewed: Allergy & Precautions, NPO status , Patient's Chart, lab work & pertinent test results  Airway Mallampati: III  TM Distance: >3 FB Neck ROM: Full    Dental  (+) Loose,    Pulmonary Current Smoker and Patient abstained from smoking.,    Pulmonary exam normal breath sounds clear to auscultation       Cardiovascular negative cardio ROS Normal cardiovascular exam Rhythm:Regular Rate:Normal     Neuro/Psych PSYCHIATRIC DISORDERS Anxiety Depression negative neurological ROS     GI/Hepatic negative GI ROS, Neg liver ROS,   Endo/Other  negative endocrine ROS  Renal/GU negative Renal ROS     Musculoskeletal negative musculoskeletal ROS (+)   Abdominal (+) + obese,   Peds  Hematology negative hematology ROS (+)   Anesthesia Other Findings Nasal fracture  Reproductive/Obstetrics hcg negative                            Anesthesia Physical Anesthesia Plan  ASA: II  Anesthesia Plan: General   Post-op Pain Management:    Induction: Intravenous  PONV Risk Score and Plan: 3 and Ondansetron, Dexamethasone, Midazolam and Treatment may vary due to age or medical condition  Airway Management Planned:   Additional Equipment:   Intra-op Plan:   Post-operative Plan: Extubation in OR  Informed Consent: I have reviewed the patients History and Physical, chart, labs and discussed the procedure including the risks, benefits and alternatives for the proposed anesthesia with the patient or authorized representative who has indicated his/her understanding and acceptance.     Dental advisory given  Plan Discussed with: CRNA  Anesthesia Plan Comments:        Anesthesia Quick Evaluation

## 2020-02-16 NOTE — Anesthesia Procedure Notes (Signed)
Procedure Name: LMA Insertion Date/Time: 02/16/2020 12:29 PM Performed by: Caren Macadam, CRNA Pre-anesthesia Checklist: Patient identified, Emergency Drugs available, Suction available and Patient being monitored Patient Re-evaluated:Patient Re-evaluated prior to induction Oxygen Delivery Method: Circle system utilized Preoxygenation: Pre-oxygenation with 100% oxygen Induction Type: IV induction Ventilation: Mask ventilation without difficulty LMA: LMA inserted LMA Size: 4.0 Number of attempts: 1 Placement Confirmation: positive ETCO2 and breath sounds checked- equal and bilateral Tube secured with: Tape Dental Injury: Teeth and Oropharynx as per pre-operative assessment

## 2020-02-16 NOTE — H&P (Signed)
[name] is an 29 y.o. female.   Chief Complaint: Nasal fracture HPI: 29 year old female fell in the shower about two weeks ago and sustained a nasal fracture.  She presents to the OR for surgical management.  Past Medical History:  Diagnosis Date  . Anxiety   . Depression   . Family history of alcohol abuse 12/14/2015  . 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 Implanon in place 12/14/2015   06/2015      Past Surgical History:  Procedure Laterality Date  . BACK SURGERY      Family History  Problem Relation Age of Onset  . Depression Mother   . Alcohol abuse Father   . Hypertension Maternal Uncle   . Depression Maternal Grandmother   . Cancer Maternal Grandfather   . Cancer Paternal Grandmother    Social History:  reports that she has been smoking cigarettes. She has a 5.00 pack-year smoking history. She has never used smokeless tobacco. She reports current alcohol use of about 42.0 standard drinks of alcohol per week. She reports current drug use. Drug: Marijuana.  Allergies:  Allergies  Allergen Reactions  . Sulfa Antibiotics Hives and Rash    Medications Prior to Admission  Medication Sig Dispense Refill  . acyclovir (ZOVIRAX) 400 MG tablet TAKE 1 TABLET BY MOUTH 5 TIMES DAILY FOR 5 DAYS START AT 1ST SIGN OF COLD SORE (Patient taking differently: Take 400 mg by mouth as needed (see below). TAKE 1 TABLET BY MOUTH 5 TIMES DAILY FOR 5 DAYS START AT 1ST SIGN OF COLD SORE) 50 tablet 1  . ALPRAZolam (XANAX) 0.5 MG tablet TAKE 1/2 TO 1 TABLET BY MOUTH TWICE A DAY AS NEEDED FOR ANXIETY (30 TABS TO LAST 90 DAYS) (Patient taking differently: Take 0.25-0.5 mg by mouth 2 (two) times daily as needed for anxiety. ) 30 tablet 0  . calcium carbonate (TUMS - DOSED IN MG ELEMENTAL CALCIUM) 500 MG chewable tablet Chew 1 tablet by mouth daily.    . naproxen (NAPROSYN) 500 MG tablet Take 1 tablet (500 mg total) by mouth 2 (two) times daily with a meal. Take every day for one week, then use as needed  after that 60 tablet 1  . sertraline (ZOLOFT) 100 MG tablet Take 1 tablet (100 mg total) by mouth daily. 90 tablet 3    Results for orders placed or performed during the hospital encounter of 02/16/20 (from the past 48 hour(s))  Pregnancy, urine POC     Status: None   Collection Time: 02/16/20 10:38 AM  Result Value Ref Range   Preg Test, Ur NEGATIVE NEGATIVE    Comment:        THE SENSITIVITY OF THIS METHODOLOGY IS >24 mIU/mL    No results found.  Review of Systems  All other systems reviewed and are negative.   Blood pressure 128/72, pulse 68, temperature 98.2 F (36.8 C), temperature source Oral, resp. rate 18, height 5\' 4"  (1.626 m), weight 88.2 kg, SpO2 98 %. Physical Exam  Constitutional: She is oriented to person, place, and time. She appears well-developed and well-nourished. No distress.  HENT:  Head: Normocephalic and atraumatic.  Right Ear: External ear normal.  Left Ear: External ear normal.  Mouth/Throat: Oropharynx is clear and moist.  Slight leftward deviation.  Eyes: Pupils are equal, round, and reactive to light. Conjunctivae and EOM are normal.  Cardiovascular: Normal rate.  Respiratory: Effort normal.  Musculoskeletal:     Cervical back: Normal range of motion and neck  supple.  Neurological: She is alert and oriented to person, place, and time. No cranial nerve deficit.  Skin: Skin is warm and dry.  Psychiatric: She has a normal mood and affect. Her behavior is normal. Judgment and thought content normal.     Assessment/Plan Nasal fracture  To OR for closed nasal reduction.  Melida Quitter, MD 02/16/2020, 12:13 PM

## 2020-02-16 NOTE — Discharge Instructions (Signed)
No Tylenol until 5:30 if needed   Post Anesthesia Home Care Instructions  Activity: Get plenty of rest for the remainder of the day. A responsible individual must stay with you for 24 hours following the procedure.  For the next 24 hours, DO NOT: -Drive a car -Advertising copywriter -Drink alcoholic beverages -Take any medication unless instructed by your physician -Make any legal decisions or sign important papers.  Meals: Start with liquid foods such as gelatin or soup. Progress to regular foods as tolerated. Avoid greasy, spicy, heavy foods. If nausea and/or vomiting occur, drink only clear liquids until the nausea and/or vomiting subsides. Call your physician if vomiting continues.  Special Instructions/Symptoms: Your throat may feel dry or sore from the anesthesia or the breathing tube placed in your throat during surgery. If this causes discomfort, gargle with warm salt water. The discomfort should disappear within 24 hours.  If you had a scopolamine patch placed behind your ear for the management of post- operative nausea and/or vomiting:  1. The medication in the patch is effective for 72 hours, after which it should be removed.  Wrap patch in a tissue and discard in the trash. Wash hands thoroughly with soap and water. 2. You may remove the patch earlier than 72 hours if you experience unpleasant side effects which may include dry mouth, dizziness or visual disturbances. 3. Avoid touching the patch. Wash your hands with soap and water after contact with the patch.

## 2020-02-16 NOTE — Anesthesia Postprocedure Evaluation (Signed)
Anesthesia Post Note  Patient: [name]  Procedure(s) Performed: CLOSED REDUCTION NASAL FRACTURE (Bilateral Nose)     Patient location during evaluation: PACU Anesthesia Type: General Level of consciousness: awake and alert Pain management: pain level controlled Vital Signs Assessment: post-procedure vital signs reviewed and stable Respiratory status: spontaneous breathing, nonlabored ventilation, respiratory function stable and patient connected to nasal cannula oxygen Cardiovascular status: blood pressure returned to baseline and stable Postop Assessment: no apparent nausea or vomiting Anesthetic complications: no    Last Vitals:  Vitals:   02/16/20 1330 02/16/20 1400  BP: (!) 145/85 (!) 144/87  Pulse: (!) 51 (!) 49  Resp: 16 18  Temp:  37.3 C  SpO2: 96% 97%    Last Pain:  Vitals:   02/16/20 1400  TempSrc:   PainSc: 4                  [name] [name]

## 2020-02-16 NOTE — Op Note (Signed)
[name], [name] MEDICAL RECORD [id number] ACCOUNT [id number] DATE OF BIRTH:03-19-1991 FACILITY: [facility] LOCATION: [facility] PHYSICIAN:Marielis Samara Pearletha Alfred, MD  OPERATIVE REPORT  DATE OF PROCEDURE:  02/16/2020  PREOPERATIVE DIAGNOSIS:  Nasal fracture.  POSTOPERATIVE DIAGNOSIS:  Nasal fracture.  PROCEDURE:  Closed nasal reduction.  SURGEON:  Christia Reading, MD  ANESTHESIA:  General LMA.  COMPLICATIONS:  None.  INDICATIONS:  The patient is a 29 year old female who fell in the shower about 2 weeks ago and sustained a leftward deviated nasal fracture.  She presents to the operating room for surgical management.  FINDINGS:  The external nose was deviated toward the left and was able to be reduced.  DESCRIPTION OF PROCEDURE:  The patient was identified in the holding room, informed consent having been obtained with discussion of risks, benefits and alternatives, the patient was brought to the operative suite and put the operative table in supine  position.  Anesthesia was induced and the patient was intubated with an LMA without difficulty.  The eyes were taped closed and the face was draped.  Afrin pledget was placed in both sides of the nose for several minutes and then removed.  A Goldman  elevator was then used inside the nose to reduce the fracture using bimanual manipulation.  After this was completed, Afrin pledgets were repositioned.  The external nasal skin was treated with benzoin and then custom cut Steri-Strips were placed.  A  thermoplast splint was cut to fit the nose and then placed in hot water until malleable.  It was then laid across the nose and hardened in place.  The nose and throat were suctioned.  During wakeup and extubation, she began to bleed heavily from the nose  and this was able to be controlled with Afrin pledgets placed in both sides of the nose and sitting her upright.  The pledgets were removed in the PACU.  CN/NUANCE  D:02/16/2020 T:02/16/2020  JOB:[id number]

## 2020-02-16 NOTE — Transfer of Care (Signed)
Immediate Anesthesia Transfer of Care Note  Patient: [name]  Procedure(s) Performed: CLOSED REDUCTION NASAL FRACTURE (Bilateral )  Patient Location: PACU  Anesthesia Type:General  Level of Consciousness: awake  Airway & Oxygen Therapy: Patient Spontanous Breathing and Patient connected to face mask oxygen  Post-op Assessment: Report given to RN and Post -op Vital signs reviewed and stable  Post vital signs: Reviewed and stable  Last Vitals:  Vitals Value Taken Time  BP 160/115 02/16/20 1258  Temp    Pulse 52 02/16/20 1259  Resp 24 02/16/20 1259  SpO2 99 % 02/16/20 1259  Vitals shown include unvalidated device data.  Last Pain:  Vitals:   02/16/20 1053  TempSrc: Oral  PainSc: 0-No pain      Patients Stated Pain Goal: 5 (02/16/20 1053)  Complications: No apparent anesthesia complications

## 2020-02-16 NOTE — Brief Op Note (Signed)
02/16/2020  12:43 PM  PATIENT:  [name]  29 y.o. female  PRE-OPERATIVE DIAGNOSIS:  nasal fracture  POST-OPERATIVE DIAGNOSIS:  nasal fracture  PROCEDURE:  Procedure(s): CLOSED REDUCTION NASAL FRACTURE (Bilateral)  SURGEON:  Surgeon(s) and Role:    Christia Reading, MD - Primary  PHYSICIAN ASSISTANT:   ASSISTANTS: none   ANESTHESIA:   general  EBL: Minimal  BLOOD ADMINISTERED:none  DRAINS: none   LOCAL MEDICATIONS USED:  NONE  SPECIMEN:  No Specimen  DISPOSITION OF SPECIMEN:  N/A  COUNTS:  YES  TOURNIQUET:  * No tourniquets in log *  DICTATION: .Other Dictation: Dictation Number [id number]  PLAN OF CARE: Discharge to home after PACU  PATIENT DISPOSITION:  PACU - hemodynamically stable.   Delay start of Pharmacological VTE agent (>24hrs) due to surgical blood loss or risk of bleeding: yes

## 2020-02-18 ENCOUNTER — Encounter: Payer: Self-pay | Admitting: *Deleted

## 2020-04-20 ENCOUNTER — Other Ambulatory Visit: Payer: Self-pay | Admitting: Osteopathic Medicine

## 2020-04-20 DIAGNOSIS — F411 Generalized anxiety disorder: Secondary | ICD-10-CM

## 2020-04-20 NOTE — Telephone Encounter (Signed)
Refill request says she has 3 refills if I refuse they will call. Last refill 01/22/2020 Last Ov- 11/13/2019

## 2020-05-10 ENCOUNTER — Telehealth: Payer: Self-pay

## 2020-05-10 NOTE — Telephone Encounter (Signed)
Patient called requesting refill on Lorazepam. Past due for follow up with PCP. Please call patient and get scheduled.

## 2020-05-10 NOTE — Telephone Encounter (Signed)
LVM to get patient scheduled for follow up appointment with PCP for refills.

## 2020-09-15 ENCOUNTER — Encounter: Payer: Self-pay | Admitting: Osteopathic Medicine

## 2020-09-23 ENCOUNTER — Ambulatory Visit: Payer: 59 | Admitting: Osteopathic Medicine

## 2020-09-23 ENCOUNTER — Telehealth: Payer: Self-pay | Admitting: Osteopathic Medicine

## 2020-09-23 NOTE — Telephone Encounter (Signed)
This second same-day cancel = no-show in less than a year Any additional no shows or same-day cancellations will result in dismissal from the practice in accordance with [facility] policy (3 no-shows in a year = dismissal) Please make patient aware of policy

## 2020-09-23 NOTE — Telephone Encounter (Signed)
Pt called at 820 to cancel. She stated she wasn't able to leave work.

## 2020-10-05 ENCOUNTER — Encounter: Payer: Self-pay | Admitting: Osteopathic Medicine

## 2020-10-05 ENCOUNTER — Other Ambulatory Visit (HOSPITAL_COMMUNITY)
Admission: RE | Admit: 2020-10-05 | Discharge: 2020-10-05 | Disposition: A | Discharge status: Home / Self Care | Payer: Self-pay | Source: Ambulatory Visit | Attending: Osteopathic Medicine | Admitting: Osteopathic Medicine

## 2020-10-05 ENCOUNTER — Other Ambulatory Visit: Payer: Self-pay

## 2020-10-05 ENCOUNTER — Ambulatory Visit (INDEPENDENT_AMBULATORY_CARE_PROVIDER_SITE_OTHER): Payer: Self-pay | Admitting: Osteopathic Medicine

## 2020-10-05 VITALS — BP 119/80 | HR 58 | Temp 97.7°F | Wt 196.0 lb

## 2020-10-05 DIAGNOSIS — Z3046 Encounter for surveillance of implantable subdermal contraceptive: Secondary | ICD-10-CM

## 2020-10-05 DIAGNOSIS — Z124 Encounter for screening for malignant neoplasm of cervix: Secondary | ICD-10-CM | POA: Insufficient documentation

## 2020-10-05 LAB — POCT URINE PREGNANCY: Preg Test, Ur: NEGATIVE

## 2020-10-05 MED ORDER — NEXPLANON 68 MG ~~LOC~~ IMPL: 1.0000 | DRUG_IMPLANT | Freq: Once | SUBCUTANEOUS | 0 refills | Status: DC

## 2020-10-05 NOTE — Patient Instructions (Signed)
Nexplanon Instructions After Insertion  Keep bandage clean and dry for 24 hours - see below for extra information on Steri-Strips wound closure care   May use ice/Tylenol/Ibuprofen for soreness or pain  If you develop fever, drainage or increased warmth from incision site-contact office immediately     Sterile Tape Wound Care  Some cuts and wounds can be closed using sterile tape, also called skin adhesive strips aka Steri-Strips. You can use skin adhesive strips for shallow (superficial) and simple cuts, wounds, skin tears (lacerations), and some surgical incisions. These strips are used in place of stitches, or along with stitches, to hold the edges of your wound together and to promote better healing. Unlike stitches, the adhesive strips do not require needles or anesthetic medicine for placement. The strips usually fall off on their own as your wound is healing. It is important to take proper care of your wound at home while it heals. Supplies needed:  Soap, water, and a clean, dry towel.  If needed: ? Wound cleanser. ? Clean gauze. ? A clean bandage (dressing) or other type of wound dressing material to cover or place on your wound. Follow instructions from your health care provider about what dressing supplies to use. ? Cream or ointment to apply to your wound, if told by your health care provider. How to care for your sterile tape wound Wound care  Try to keep the area around your wound clean and dry. Do not allow the adhesive strips to get wet for the first 12 hours.  Do not use any soaps or ointments on the wound for the first 12 hours.  Ask your health care provider how to clean your wound. This may include: ? Using mild soap and water or a wound cleanser. ? Using a clean gauze to pat the wound dry after cleaning it. Do not rub or scrub your wound.  Do not scratch, rub, or pick at your wound area.  Protect your wound from further injury until it is healed.  Protect  your wound from sun and tanning bed exposure while it is healing, and for several weeks after healing. Dressing care  If a dressing was put on your wound, follow instructions from your health care provider about how often to change your dressing. Make sure you: ? Wash your hands with soap and water for at least 20 seconds before and after you change your dressing. If soap and water are not available, use hand sanitizer. ? Change your dressing as told by your health care provider. ? Leave adhesive strips in place. These skin closures may need to stay in place for 2 weeks or longer. If adhesive strip edges start to loosen and curl up, you may trim the loose edges. Do not remove adhesive strips completely unless your health care provider tells you to do that.  Keep your dressing dry.  Ask your health care provider when you can leave your wound uncovered. Checking for infection Check your wound every day for signs of infection. Check for:  More redness, swelling, or pain.  More fluid or blood.  Warmth.  Pus or a bad smell. Follow these instructions at home: Medicines  Take over-the-counter and prescription medicines only as told by your health care provider.  If you were prescribed an antibiotic medicine, take or apply it as told by your health care provider. Do not stop using the antibiotic even if you start to feel better. General instructions  Do not use any products that contain  nicotine or tobacco, such as cigarettes, e-cigarettes, and chewing tobacco. These may delay wound healing. If you need help quitting, ask your health care provider.  Keep all follow-up visits as told by your health care provider. This is important. Contact a health care provider if:  Your adhesive strips become soaked with blood or fall off before your wound has healed. The tape will need to be replaced.  You have a fever. Get help right away if:  You develop a rash after the strips are applied.  You  have a red streak that goes away from your wound.  You have any of these signs of infection: ? More redness, swelling, or pain around your wound. ? More fluid or blood coming from your wound. ? Warmth coming from your wound. ? Pus or a bad smell coming from your wound. ? Chills.  Your wound breaks open. Summary  Some cuts and wounds can be closed using sterile tape, or skin adhesive strips, without the need for stitches.  The strips usually fall off on their own as your wound is healing.  It is important to take proper care of your wound at home while it heals and to clean it as told by your health care provider. This information is not intended to replace advice given to you by your health care provider. Make sure you discuss any questions you have with your health care provider. Document Revised: 11/20/2019 Document Reviewed: 08/28/2019 Elsevier Patient Education  2021 [employer].

## 2020-10-05 NOTE — Progress Notes (Signed)
[name] is a 30 y.o. female who presents to  [facility] at [facility]  today, 10/05/20, seeking care for the following:  . Removal and reinsertion of Nexplanon device  . Pt requests Pap as well today      ASSESSMENT & PLAN with other pertinent findings:  The primary encounter diagnosis was Encounter for removal and reinsertion of Nexplanon. A diagnosis of Cervical cancer screening was also pertinent to this visit.   No results found for this or any previous visit (from the past 24 hour(s)).     Patient Instructions  Nexplanon Instructions After Insertion  Keep bandage clean and dry for 24 hours - see below for extra information on Steri-Strips wound closure care   May use ice/Tylenol/Ibuprofen for soreness or pain  If you develop fever, drainage or increased warmth from incision site-contact office immediately     Sterile Tape Wound Care  Some cuts and wounds can be closed using sterile tape, also called skin adhesive strips aka Steri-Strips. You can use skin adhesive strips for shallow (superficial) and simple cuts, wounds, skin tears (lacerations), and some surgical incisions. These strips are used in place of stitches, or along with stitches, to hold the edges of your wound together and to promote better healing. Unlike stitches, the adhesive strips do not require needles or anesthetic medicine for placement. The strips usually fall off on their own as your wound is healing. It is important to take proper care of your wound at home while it heals. Supplies needed:  Soap, water, and a clean, dry towel.  If needed: ? Wound cleanser. ? Clean gauze. ? A clean bandage (dressing) or other type of wound dressing material to cover or place on your wound. Follow instructions from your health care provider about what dressing supplies to use. ? Cream or ointment to apply to your wound, if told by your health care provider. How  to care for your sterile tape wound Wound care  Try to keep the area around your wound clean and dry. Do not allow the adhesive strips to get wet for the first 12 hours.  Do not use any soaps or ointments on the wound for the first 12 hours.  Ask your health care provider how to clean your wound. This may include: ? Using mild soap and water or a wound cleanser. ? Using a clean gauze to pat the wound dry after cleaning it. Do not rub or scrub your wound.  Do not scratch, rub, or pick at your wound area.  Protect your wound from further injury until it is healed.  Protect your wound from sun and tanning bed exposure while it is healing, and for several weeks after healing. Dressing care  If a dressing was put on your wound, follow instructions from your health care provider about how often to change your dressing. Make sure you: ? Wash your hands with soap and water for at least 20 seconds before and after you change your dressing. If soap and water are not available, use hand sanitizer. ? Change your dressing as told by your health care provider. ? Leave adhesive strips in place. These skin closures may need to stay in place for 2 weeks or longer. If adhesive strip edges start to loosen and curl up, you may trim the loose edges. Do not remove adhesive strips completely unless your health care provider tells you to do that.  Keep your dressing dry.  Ask your  health care provider when you can leave your wound uncovered. Checking for infection Check your wound every day for signs of infection. Check for:  More redness, swelling, or pain.  More fluid or blood.  Warmth.  Pus or a bad smell. Follow these instructions at home: Medicines  Take over-the-counter and prescription medicines only as told by your health care provider.  If you were prescribed an antibiotic medicine, take or apply it as told by your health care provider. Do not stop using the antibiotic even if you start to  feel better. General instructions  Do not use any products that contain nicotine or tobacco, such as cigarettes, e-cigarettes, and chewing tobacco. These may delay wound healing. If you need help quitting, ask your health care provider.  Keep all follow-up visits as told by your health care provider. This is important. Contact a health care provider if:  Your adhesive strips become soaked with blood or fall off before your wound has healed. The tape will need to be replaced.  You have a fever. Get help right away if:  You develop a rash after the strips are applied.  You have a red streak that goes away from your wound.  You have any of these signs of infection: ? More redness, swelling, or pain around your wound. ? More fluid or blood coming from your wound. ? Warmth coming from your wound. ? Pus or a bad smell coming from your wound. ? Chills.  Your wound breaks open. Summary  Some cuts and wounds can be closed using sterile tape, or skin adhesive strips, without the need for stitches.  The strips usually fall off on their own as your wound is healing.  It is important to take proper care of your wound at home while it heals and to clean it as told by your health care provider. This information is not intended to replace advice given to you by your health care provider. Make sure you discuss any questions you have with your health care provider. Document Revised: 11/20/2019 Document Reviewed: 08/28/2019 Elsevier Patient Education  2021 [employer] Inc[employer]      No orders of the defined types were placed in this encounter.   Meds ordered this encounter  Medications  . etonogestrel (NEXPLANON) 68 MG IMPL implant    Sig: 1 each (68 mg total) by Subdermal route once for 1 dose. Inserted 10/05/20 w/ Dr Lyn Hollingshead at [facility]    Dispense:  1 each    Refill:  0       Follow-up instructions: Return for ANNUAL CHECK-UP SOMETIME IN THE NEXT YEAR - SEE [place]  OTHERWISE AS NEEDED.                                         BP 119/80 (BP Location: Left Arm, Patient Position: Sitting, Cuff Size: Normal)   Pulse (!) 58   Temp 97.7 F (36.5 C) (Oral)   Wt 196 lb 0.6 oz (88.9 kg)   BMI 33.65 kg/m  GYN: No lesions/ulcers to external genitalia, normal urethra, normal vaginal mucosa, physiologic discharge, cervix normal without lesions, uterus not enlarged or tender, adnexa no masses and nontender   Current Meds  Medication Sig  . etonogestrel (NEXPLANON) 68 MG IMPL implant 1 each (68 mg total) by Subdermal route once for 1 dose. Inserted 10/05/20 w/ Dr Lyn Hollingshead at [facility]  No results found for this or any previous visit (from the past 72 hour(s)).  No results found.  NEXPLANON REMOVAL  PRE-OP DIAGNOSIS: Nexplanon expired, desire for continuation of contraception  POST-OP DIAGNOSIS: Same  PROCEDURE: Nexplanon Removal  Risks and benefits reviewed with the patient. Informed consent obtained, patient opts to proceed today.  Anesthesia: 2% Lidocaine w/ epinephrine 5 ml  Procedure: Consent obtained. A time-out was performed prior to initiating procedure to be sure of right patient and right location. The area surrounding the Nexplanon was prepared in the usual sterile manner. The site was anesthetized with lidocaine. A skin incision was made over the distal aspect of the device. The capsule lysed sharply and the device removed using a hemostat. Hemostasis was assured.    NEXPLANON INSERTION PRE-OP DIAGNOSIS: desired long-term, reversible contraception  POST-OP DIAGNOSIS: Same  PROCEDURE: Nexplanon  placement Risks and benefits reviewed with the patient. Informed consent obtained, patient opts to proceed today.   Nexplanon  trocar was inserted subcutaneously at site of removal or old device, and then Nexplanon  capsule delivered subcutaneously Trocar was removed from the insertion  site. Nexplanon  capsule was palpated by provider and patient to assure satisfactory placement. Estimated blood loss <1 mL Dressings applied: Steri-Strip and small pressure bandage Followup: The patient tolerated the procedure well without complications.  Standard post-procedure care is explained and return precautions are given.    All questions at time of visit were answered - patient instructed to contact office with any additional concerns or updates.  ER/RTC precautions were reviewed with the patient as applicable.   Please note: voice recognition software was used to produce this document, and typos may escape review. Please contact Dr. Lyn Hollingshead for any needed clarifications.

## 2020-10-07 LAB — CYTOLOGY - PAP
Adequacy: ABSENT
Comment: NEGATIVE
Diagnosis: NEGATIVE
High risk HPV: NEGATIVE

## 2021-03-01 ENCOUNTER — Other Ambulatory Visit: Payer: Self-pay | Admitting: Osteopathic Medicine

## 2021-03-01 DIAGNOSIS — Z8619 Personal history of other infectious and parasitic diseases: Secondary | ICD-10-CM

## 2021-06-21 IMAGING — CT CT MAXILLOFACIAL W/O CM
2 series · 15 of 40 positions shown, 18 images · non-contrast
Comparison: April 01, 2009.

CLINICAL DATA: Posttraumatic headache after fall last night.

EXAM:
CT HEAD WITHOUT CONTRAST
CT MAXILLOFACIAL WITHOUT CONTRAST
TECHNIQUE: Multidetector CT imaging of the head and maxillofacial structures
were performed using the standard protocol without intravenous
contrast. Multiplanar CT image reconstructions of the maxillofacial
structures were also generated.

[Series 5: st thins · axial · 0.39mm/px · z∈[+1216,+1366]mm · 12 of 349 slices shown, 15 images]
[im 25/349  brain]
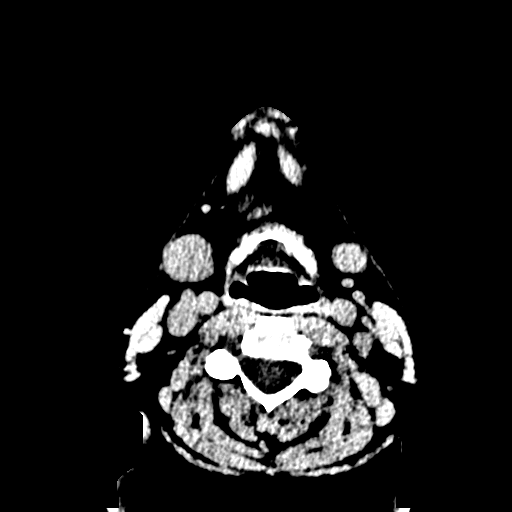
[im 25/349  bone]
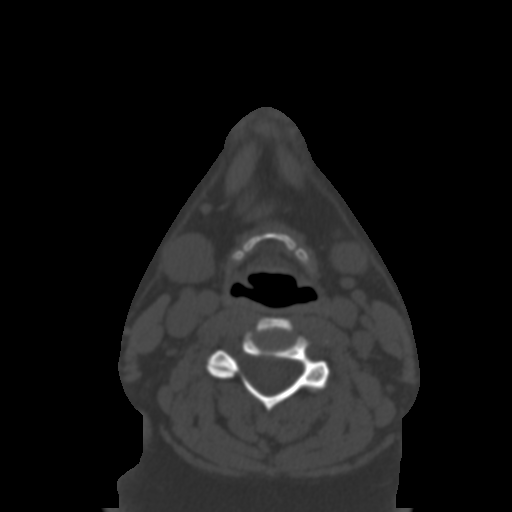
[im 49/349  bone]
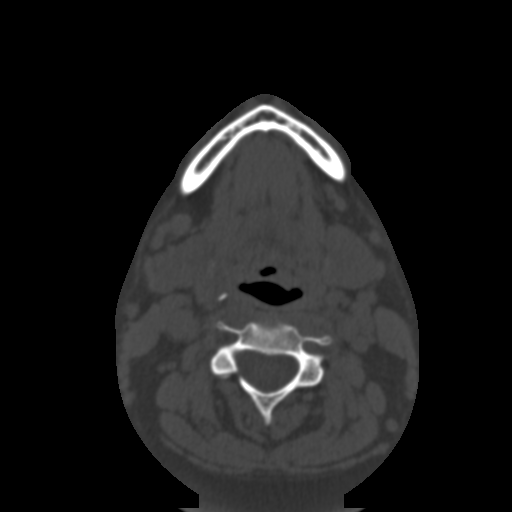
[im 73/349  bone]
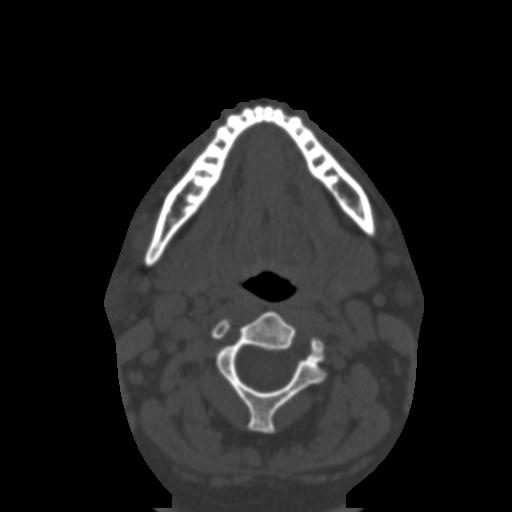
[im 109/349  bone]
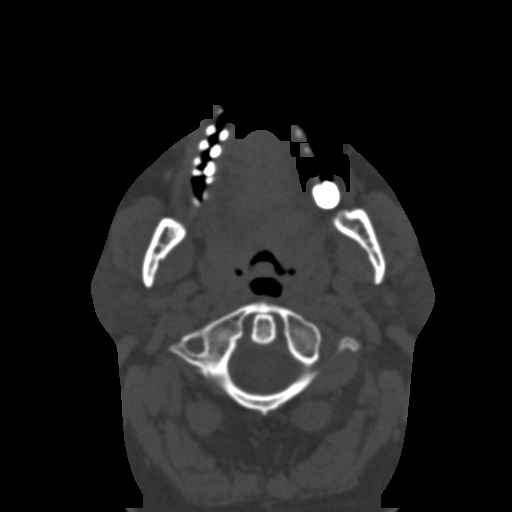
[im 133/349  brain]
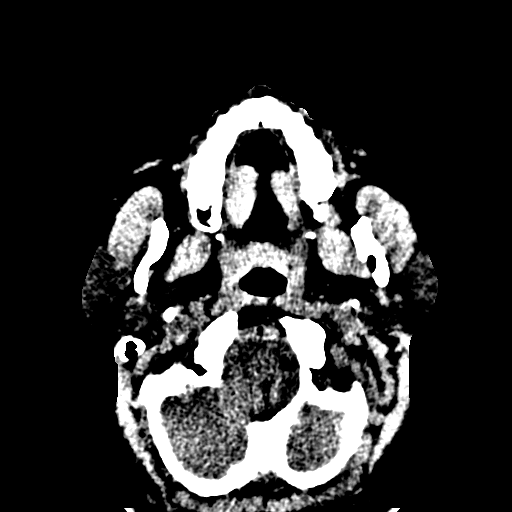
[im 133/349  bone]
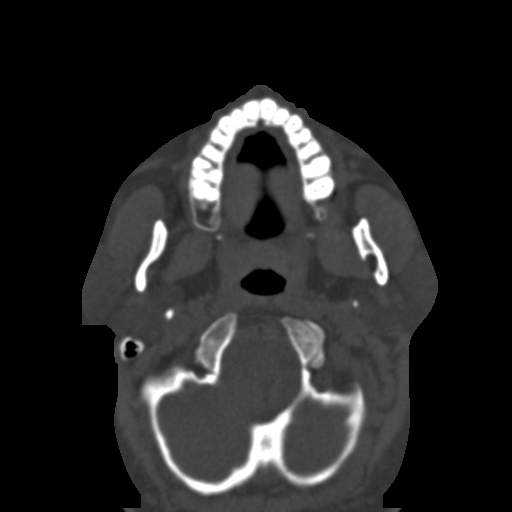
[im 157/349  bone]
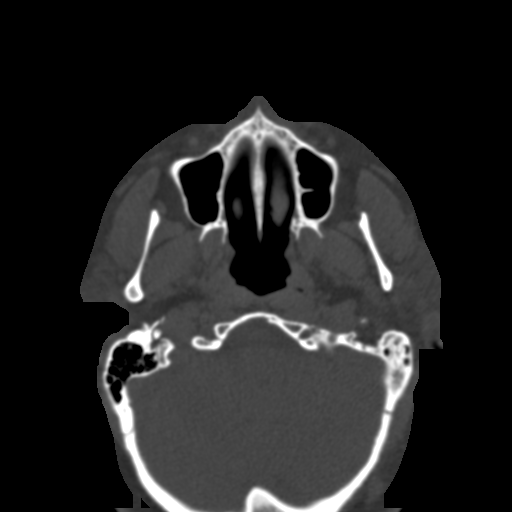
[im 193/349  bone]
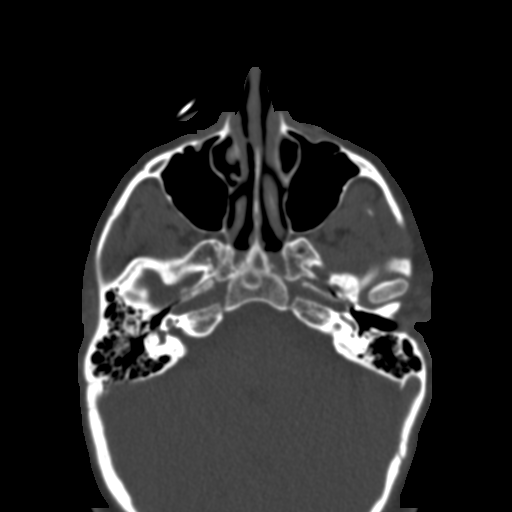
[im 217/349  bone]
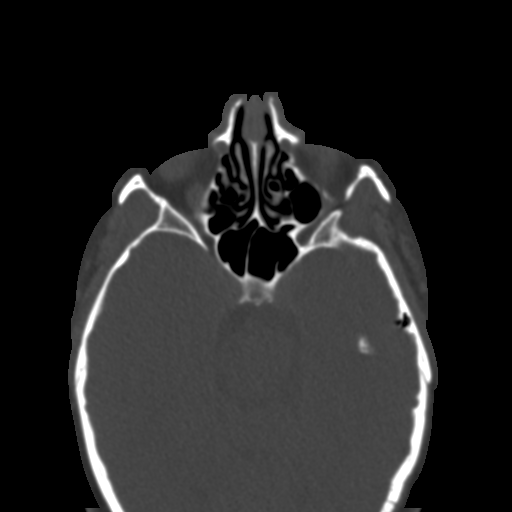
[im 241/349  brain]
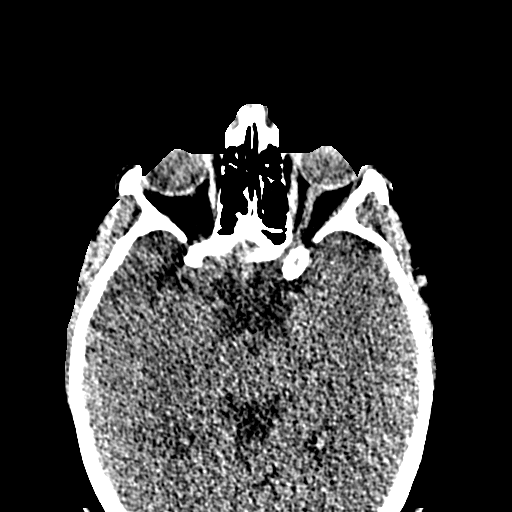
[im 241/349  bone]
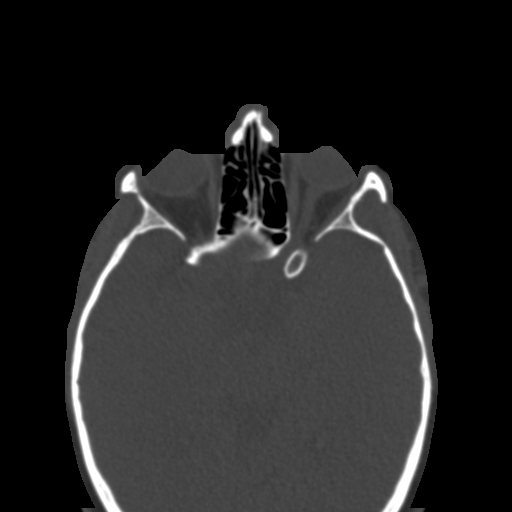
[im 277/349  bone]
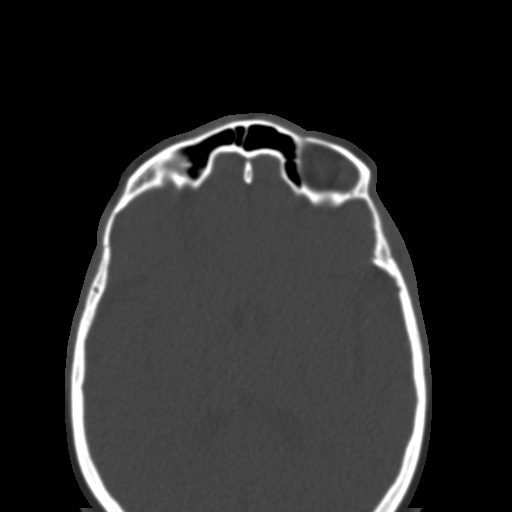
[im 301/349  bone]
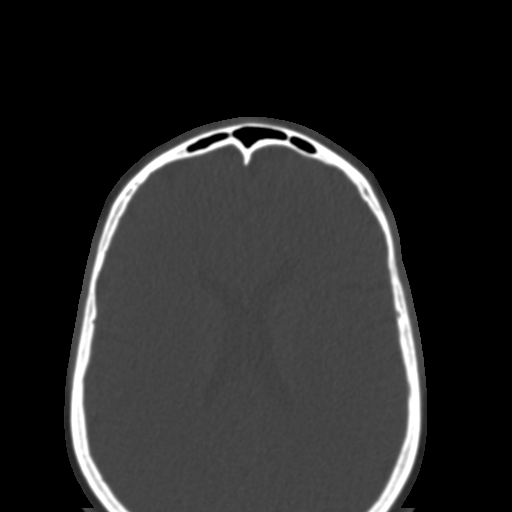
[im 325/349  bone]
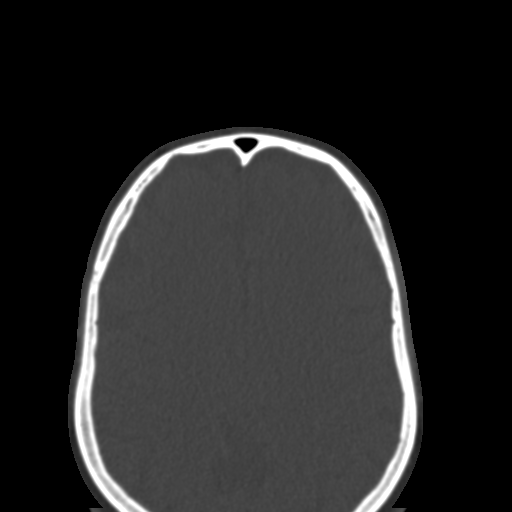

[Series 8: sagittal soft · coronal · 0.36mm/px · 3 of 105 slices shown]
[im 35/105  bone]
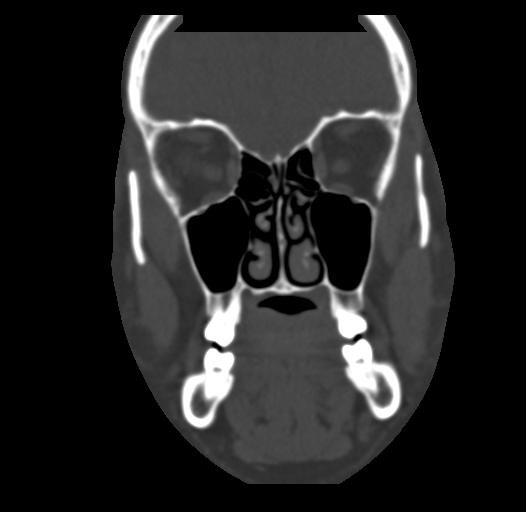
[im 47/105  bone]
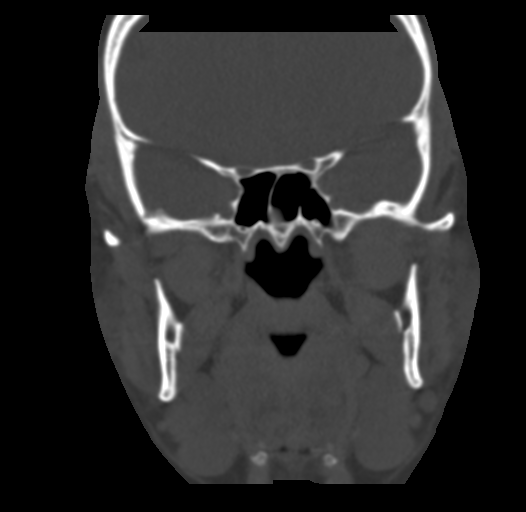
[im 58/105  bone]
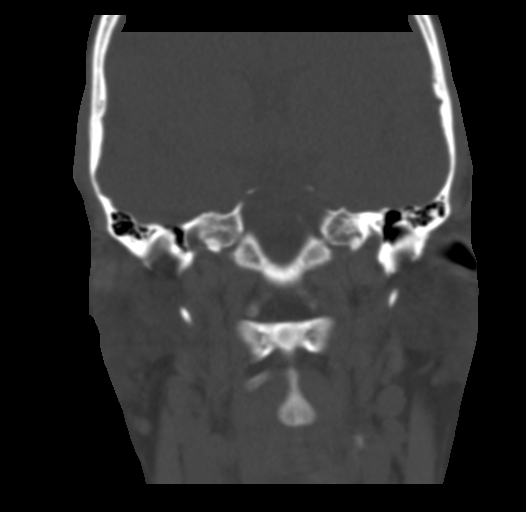

[15 of 40 positions shown; findings below may reference images not displayed]

FINDINGS: CT HEAD FINDINGS

Brain: No evidence of acute infarction, hemorrhage, hydrocephalus,
extra-axial collection or mass lesion/mass effect.

Vascular: No hyperdense vessel or unexpected calcification.

Skull: Normal. Negative for fracture or focal lesion.

Other: None.

CT MAXILLOFACIAL FINDINGS

Osseous: Minimally displaced left nasal bone fracture is noted.

Orbits: Negative. No traumatic or inflammatory finding.

Sinuses: Clear.

Soft tissues: Negative.
IMPRESSION: 1. Normal head CT.
2. Minimally displaced left nasal bone fracture. No other
abnormality seen in the maxillofacial region.

## 2021-08-11 ENCOUNTER — Encounter (HOSPITAL_BASED_OUTPATIENT_CLINIC_OR_DEPARTMENT_OTHER): Payer: Self-pay | Admitting: Emergency Medicine

## 2021-08-11 ENCOUNTER — Other Ambulatory Visit: Payer: Self-pay

## 2021-08-11 ENCOUNTER — Emergency Department (HOSPITAL_BASED_OUTPATIENT_CLINIC_OR_DEPARTMENT_OTHER)
Admission: EM | Admit: 2021-08-11 | Discharge: 2021-08-11 | Disposition: A | Discharge status: Home / Self Care | Payer: Self-pay | Attending: Emergency Medicine | Admitting: Emergency Medicine

## 2021-08-11 DIAGNOSIS — F17210 Nicotine dependence, cigarettes, uncomplicated: Secondary | ICD-10-CM | POA: Insufficient documentation

## 2021-08-11 DIAGNOSIS — R197 Diarrhea, unspecified: Secondary | ICD-10-CM | POA: Insufficient documentation

## 2021-08-11 DIAGNOSIS — R1032 Left lower quadrant pain: Secondary | ICD-10-CM | POA: Insufficient documentation

## 2021-08-11 LAB — LIPASE, BLOOD: Lipase: 12 U/L (ref 11–51)

## 2021-08-11 LAB — COMPREHENSIVE METABOLIC PANEL
ALT: 55 U/L — ABNORMAL HIGH (ref 0–44)
AST: 22 U/L (ref 15–41)
Albumin: 4.3 g/dL (ref 3.5–5.0)
Alkaline Phosphatase: 82 U/L (ref 38–126)
Anion gap: 6 (ref 5–15)
BUN: 10 mg/dL (ref 6–20)
CO2: 27 mmol/L (ref 22–32)
Calcium: 9.6 mg/dL (ref 8.9–10.3)
Chloride: 103 mmol/L (ref 98–111)
Creatinine, Ser: 0.61 mg/dL (ref 0.44–1.00)
GFR, Estimated: >60 mL/min (ref >60)
Glucose, Bld: 94 mg/dL (ref 70–99)
Potassium: 3.8 mmol/L (ref 3.5–5.1)
Sodium: 136 mmol/L (ref 135–145)
Total Bilirubin: 0.4 mg/dL (ref 0.3–1.2)
Total Protein: 7 g/dL (ref 6.5–8.1)

## 2021-08-11 LAB — PREGNANCY, URINE: Preg Test, Ur: NEGATIVE

## 2021-08-11 LAB — URINALYSIS, ROUTINE W REFLEX MICROSCOPIC
Bilirubin Urine: NEGATIVE
Glucose, UA: NEGATIVE mg/dL
Hgb urine dipstick: NEGATIVE
Ketones, ur: NEGATIVE mg/dL
Leukocytes,Ua: NEGATIVE
Nitrite: NEGATIVE
Protein, ur: NEGATIVE mg/dL
Specific Gravity, Urine: 1.029 (ref 1.005–1.030)
pH: 5 (ref 5.0–8.0)

## 2021-08-11 LAB — CBC
HCT: 46.1 % — ABNORMAL HIGH (ref 36.0–46.0)
Hemoglobin: 15.1 g/dL — ABNORMAL HIGH (ref 12.0–15.0)
MCH: 31.5 pg (ref 26.0–34.0)
MCHC: 32.8 g/dL (ref 30.0–36.0)
MCV: 96 fL (ref 80.0–100.0)
Platelets: 286 10*3/uL (ref 150–400)
RBC: 4.8 MIL/uL (ref 3.87–5.11)
RDW: 13 % (ref 11.5–15.5)
WBC: 8.1 10*3/uL (ref 4.0–10.5)
nRBC: 0 % (ref 0.0–0.2)

## 2021-08-11 NOTE — Discharge Instructions (Signed)
Seen here today for mild left lower quadrant abdominal pain.  Please see additional information on abdominal pain included.  Your labs were normal.  If you have any worsening abdominal pain, pain when you urinate, pain with defecation, blood with defecation, please return to the nearest emergency department.  If you have any concern, new or worsening symptoms, please return to the nearest emergency department for evaluation.

## 2021-08-11 NOTE — ED Provider Notes (Signed)
[facility]   CSN: [id number] Arrival date & time: 08/11/21  1638     History Chief Complaint  Patient presents with   Abdominal Pain    [name] is a 30 y.o. female presents to the emergency department for evaluation of a "awareness of "her left lower quadrant with possible burning sensation since today.  Patient ports she had 3 episodes of diarrhea with the last one having a few blood drops in the toilet.  She denies any bright red blood per rectum.  Denies any nausea, vomiting, fevers, dark or tarry stools, chest pain, shortness of breath, dysuria, urgency, frequency, hematuria.  Denies any falls, traumas, MVC's.  Denies any new foods or recent travel.  She denies any medical history.  Surgical history includes surgery for scoliosis.  No daily medications.  Allergic to sulfa medications.  Patient is a daily smoker and drinks between 1 to 5 glasses of wine per night with occasional marijuana use.  Denies any IV drug use ever.   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     Family History  Problem Relation Age of Onset   Depression Mother    Alcohol abuse Father    Hypertension Maternal Uncle    Depression Maternal Grandmother    Cancer Maternal Grandfather    Cancer Paternal Grandmother     Social History   Tobacco Use   Smoking status: Every Day    Packs/day: 0.50    Years: 10.00    Pack years: 5.00    Types: Cigarettes   Smokeless tobacco: Never  Vaping Use   Vaping Use: Never used  Substance Use Topics   Alcohol use: Yes    Alcohol/week: 12.0 standard drinks    Types: 12 Glasses of wine per week   Drug use: Yes    Frequency: 3.0 times per week    Types: Marijuana    Home Medications Prior to Admission medications   Medication Sig Start Date End Date Taking? Authorizing Provider  calcium carbonate (TUMS - DOSED IN MG ELEMENTAL CALCIUM) 500 MG chewable tablet Chew 1 tablet by mouth daily.   Yes [provider]  acyclovir (ZOVIRAX) 400 MG tablet Take 1 tablet (400 mg total) by mouth as needed (see below). TAKE 1 TABLET BY MOUTH 5 TIMES DAILY FOR 5 DAYS START AT 1ST SIGN OF COLD SORE 03/01/21   Sunnie NielsenAlexander, Natalie,  DO  etonogestrel (NEXPLANON) 68 MG IMPL implant 1 each (68 mg total) by Subdermal route once for 1 dose. Inserted 10/05/20 w/ Dr Sheppard Coil at [facility] 10/05/20 10/05/20  Emeterio Reeve, DO    Allergies    Sulfa antibiotics  Review of Systems   Review of Systems  Constitutional:  Negative for chills and fever.  HENT:  Negative for ear pain and sore throat.   Eyes:  Negative for pain and visual disturbance.  Respiratory:  Negative for cough and shortness of breath.   Cardiovascular:  Negative for chest pain and palpitations.  Gastrointestinal:  Positive for abdominal pain and diarrhea. Negative for constipation, nausea, rectal pain and vomiting.  Genitourinary:  Negative for dysuria,  frequency, hematuria, urgency, vaginal bleeding and vaginal discharge.  Musculoskeletal:  Negative for arthralgias and back pain.  Skin:  Negative for color change and rash.  Neurological:  Negative for seizures and syncope.  All other systems reviewed and are negative.  Physical Exam Updated Vital Signs BP 134/87   Pulse 80   Temp 98.6 F (37 C) (Oral)   Resp 18   Ht 5\' 4"  (1.626 m)   Wt 81.6 kg   SpO2 99%   BMI 30.90 kg/m   Physical Exam Vitals and nursing note reviewed.  Constitutional:      General: She is not in acute distress.    Appearance: Normal appearance. She is not toxic-appearing.  HENT:     Head: Normocephalic and atraumatic.  Eyes:     General: No scleral icterus. Cardiovascular:     Rate and Rhythm: Normal rate and regular rhythm.  Pulmonary:     Effort: Pulmonary effort is normal. No respiratory distress.     Breath sounds: Normal breath sounds.  Abdominal:     General: Abdomen is flat. Bowel sounds are normal. There is no distension.     Palpations: Abdomen is soft. There is no hepatomegaly, splenomegaly or mass.     Tenderness: There is no abdominal tenderness. There is no guarding or rebound. Negative signs include Murphy's sign and Rovsing's sign.     Comments: No overlying skin changes noted.  Patient has a small pinpoint lesion.  No guarding or rebound.  Patient reports it is more of an "awareness" and isn't tender when I palpate the area. NBS  Musculoskeletal:        General: No deformity.     Cervical back: Normal range of motion.  Skin:    General: Skin is warm and dry.  Neurological:     General: No focal deficit present.     Mental Status: She is alert. Mental status is at baseline.    ED Results / Procedures / Treatments   Labs (all labs ordered are listed, but only abnormal results are displayed) Labs Reviewed  COMPREHENSIVE METABOLIC PANEL - Abnormal; Notable for the following components:      Result Value   ALT 55 (*)    All other  components within normal limits  CBC - Abnormal; Notable for the following components:   Hemoglobin 15.1 (*)    HCT 46.1 (*)    All other components within normal limits  LIPASE, BLOOD  URINALYSIS, ROUTINE W REFLEX MICROSCOPIC  PREGNANCY, URINE    EKG None  Radiology No results found.  Procedures Procedures   Medications Ordered in ED Medications - No data to display  ED Course  I have reviewed the triage vital signs and the nursing notes.  Pertinent labs &  imaging results that were available during my care of the patient were reviewed by me and considered in my medical decision making (see chart for details).  30 year old female presents to the emergency department for questionable abdominal pain and diarrhea.  CMP shows no electrolyte abnormalities.  ALT mildly elevated at 55.  Lipase negative.  hCG negative.  Urinalysis is negative as well.  CBC shows no leukocytosis, however patient is hemoconcentrated.  Patient is a smoker which is likely the etiology of this.  Given these negative labs, I discussed with her the pros and cons of a CT scan.  I discussed that this is likely a viral gastroenteritis, but cannot rule that in completely.  She informed that she would like to hold off on a CT scan unless this pain has worsened or lasted longer than a day.  On physical exam, her exam was unremarkable.  She is nontender to palpation, only mentions that she has a "awareness" of her left lower quadrant.  No palpable masses felt.  She does not have any urinary or other GI complaints.  No dark or tarry stools.  Patient has been intermittently bradycardic down to the 50s.  In comparison to previous charts, this is common for her.  Low suspicion for diverticulitis given the patient's age, normal white count, and nontender abdomen.  This is likely a viral gastroenteritis in nature as the patient reports she has been feeling better throughout the day as well.  Return precautions discussed.   Recommended follow-up with her PCP.  Patient agrees with plan.  Patient is stable and being discharged home in good condition.    MDM Rules/Calculators/A&P                          Final Clinical Impression(s) / ED Diagnoses Final diagnoses:  Left lower quadrant abdominal pain    Rx / DC Orders ED Discharge Orders     None        Achille Rich, PA-C 08/15/21 0103    Terald Sleeper, MD 08/15/21 [phone]

## 2021-08-11 NOTE — ED Triage Notes (Signed)
Pt via pov from home with LUQ abdominal pain that started today. Pt denies n/v/sob. States she had several episodes of diarrhea this morning and thinks it may have had some blood in it. Pt alert & oriented, nad noted.

## 2021-08-12 ENCOUNTER — Telehealth: Payer: Self-pay | Admitting: General Practice

## 2021-08-12 NOTE — Telephone Encounter (Signed)
Transition Care Management Follow-up Telephone Call Date of discharge and from where: 08/11/21 from [facility] How have you been since you were released from the hospital? Doing ok. Any questions or concerns? No  Items Reviewed: Did the pt receive and understand the discharge instructions provided? Yes  Medications obtained and verified? No  Other? No  Any new allergies since your discharge? No  Dietary orders reviewed? Yes Do you have support at home? Yes   Home Care and Equipment/Supplies: Were home health services ordered? no  Functional Questionnaire: (I = Independent and D = Dependent) ADLs: I  Bathing/Dressing- I  Meal Prep- I  Eating- I  Maintaining continence- I  Transferring/Ambulation- I  Managing Meds- I  Follow up appointments reviewed:  PCP Hospital f/u appt confirmed?  Patient stated that she is feeling better. She will call back to make an appt if her symptoms worsen.   Specialist Hospital f/u appt confirmed? No   Are transportation arrangements needed? No  If their condition worsens, is the pt aware to call PCP or go to the Emergency Dept.? Yes Was the patient provided with contact information for the PCP's office or ED? Yes Was to pt encouraged to call back with questions or concerns? Yes

## 2021-12-22 ENCOUNTER — Ambulatory Visit (INDEPENDENT_AMBULATORY_CARE_PROVIDER_SITE_OTHER): Payer: Self-pay | Admitting: Sports Medicine

## 2021-12-22 ENCOUNTER — Other Ambulatory Visit: Payer: Self-pay | Admitting: Sports Medicine

## 2021-12-22 VITALS — BP 136/82 | HR 77 | Wt 194.0 lb

## 2021-12-22 DIAGNOSIS — W460XXA Contact with hypodermic needle, initial encounter: Secondary | ICD-10-CM

## 2021-12-22 DIAGNOSIS — R1010 Upper abdominal pain, unspecified: Secondary | ICD-10-CM | POA: Insufficient documentation

## 2021-12-22 DIAGNOSIS — F411 Generalized anxiety disorder: Secondary | ICD-10-CM

## 2021-12-22 LAB — POCT URINALYSIS DIP (CLINITEK)
Bilirubin, UA: NEGATIVE
Blood, UA: NEGATIVE
Glucose, UA: NEGATIVE mg/dL
Ketones, POC UA: NEGATIVE mg/dL
Leukocytes, UA: NEGATIVE
Nitrite, UA: NEGATIVE
POC PROTEIN,UA: NEGATIVE
Spec Grav, UA: >=1.03 — AB (ref 1.010–1.025)
Urobilinogen, UA: 0.2 E.U./dL
pH, UA: 6 (ref 5.0–8.0)

## 2021-12-22 NOTE — Addendum Note (Signed)
Addended by: Gaynelle Arabian on: 12/22/2021 11:55 AM ? ? Modules accepted: Orders ? ?

## 2021-12-22 NOTE — Assessment & Plan Note (Addendum)
This is a pleasant 31 year old female, she works in English as a second language teacher, she has had a few episodes on and off for abdominal pain, the most recent of which started about 3 weeks ago, midepigastric and periumbilical with radiation to the back. ?She has also noted some loose stools. ?She has no urinary symptoms in fact urinalysis is completely negative today. ?On further questioning she does tend to drink a great deal of alcohol, at least a bottle of wine at night. ?We will go ahead and do labs including CBC, CMP, amylase, lipase. ?If there is any abnormalities we will proceed with CT abdomen pelvis with oral and IV contrast. ?Her exam was for the most part unremarkable today. ?She is establishing care with Dr. Ashley Royalty in May. ?If all of the above is unrevealing we will consider referral to gastroenterology for upper and lower endoscopy. ? ?Labs unrevealing with the exception of mild transaminitis, proceeding with CT abdomen and pelvis with oral and IV contrast as well as referral to gastroenterology for consideration of upper endoscopy/lower endoscopy, also adding pantoprazole. ?

## 2021-12-22 NOTE — Assessment & Plan Note (Signed)
Needlestick injury at work about a month ago. ?Adding acute hepatitis panel and HIV antibodies. ?

## 2021-12-22 NOTE — Progress Notes (Addendum)
? ? ?  Procedures performed today:   ? ?None. ? ?Independent interpretation of notes and tests performed by another provider:  ? ?None. ? ?Brief History, Exam, Impression, and Recommendations:   ? ?Acute upper abdominal pain ?This is a pleasant 31 year old female, she works in [employer] as a second language teacher, she has had a few episodes on and off for abdominal pain, the most recent of which started about 3 weeks ago, midepigastric and periumbilical with radiation to the back. ?She has also noted some loose stools. ?She has no urinary symptoms in fact urinalysis is completely negative today. ?On further questioning she does tend to drink a great deal of alcohol, at least a bottle of wine at night. ?We will go ahead and do labs including CBC, CMP, amylase, lipase. ?If there is any abnormalities we will proceed with CT abdomen pelvis with oral and IV contrast. ?Her exam was for the most part unremarkable today. ?She is establishing care with Dr. Ashley Royalty in May. ?If all of the above is unrevealing we will consider referral to gastroenterology for upper and lower endoscopy. ? ?Labs unrevealing with the exception of mild transaminitis, proceeding with CT abdomen and pelvis with oral and IV contrast as well as referral to gastroenterology for consideration of upper endoscopy/lower endoscopy, also adding pantoprazole. ? ?Needlestick injury due to hypodermic needle ?Needlestick injury at work about a month ago. ?Adding acute hepatitis panel and HIV antibodies. ? ?Generalized anxiety disorder ?[name] does have some generalized anxiety, depression, symptoms were relatively well controlled historically on Zoloft, she has been on a few other medications in the past. 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 ? ? ? ?___________________________________________ ?Ihor Austin. Benjamin Stain, M.D., ABFM., CAQSM. ?Primary Care and Sports Medicine ?[facility] ? ?Adjunct Instructor of Family Medicine  ?[employer] ?

## 2021-12-22 NOTE — Assessment & Plan Note (Signed)
Soren does have some generalized anxiety, depression, symptoms were relatively well controlled historically on Zoloft, she has been on a few other medications in the past. ?She has self discontinued her Zoloft due to what sounds to be anorgasmia, I did inform her that there were additional medications such as Viibryd and Trintellix that had a lower incidence, she will discuss this with her PCP. ?

## 2021-12-23 LAB — COMPREHENSIVE METABOLIC PANEL
AG Ratio: 1.7 (calc) (ref 1.0–2.5)
ALT: 35 U/L — ABNORMAL HIGH (ref 6–29)
AST: 15 U/L (ref 10–30)
Albumin: 4.1 g/dL (ref 3.6–5.1)
Alkaline phosphatase (APISO): 98 U/L (ref 31–125)
BUN: 13 mg/dL (ref 7–25)
CO2: 25 mmol/L (ref 20–32)
Calcium: 9.8 mg/dL (ref 8.6–10.2)
Chloride: 106 mmol/L (ref 98–110)
Creat: 0.7 mg/dL (ref 0.50–0.97)
Globulin: 2.4 g/dL (calc) (ref 1.9–3.7)
Glucose, Bld: 90 mg/dL (ref 65–99)
Potassium: 4.7 mmol/L (ref 3.5–5.3)
Sodium: 139 mmol/L (ref 135–146)
Total Bilirubin: 0.3 mg/dL (ref 0.2–1.2)
Total Protein: 6.5 g/dL (ref 6.1–8.1)

## 2021-12-23 LAB — LIPASE: Lipase: 16 U/L (ref 7–60)

## 2021-12-23 LAB — HEPATITIS PANEL, ACUTE
Hep A IgM: NONREACTIVE
Hep B C IgM: NONREACTIVE
Hepatitis B Surface Ag: NONREACTIVE
Hepatitis C Ab: NONREACTIVE
SIGNAL TO CUT-OFF: 0.04 (ref <1.00)

## 2021-12-23 LAB — CBC WITH DIFFERENTIAL/PLATELET
Absolute Monocytes: 585 cells/uL (ref 200–950)
Basophils Absolute: 53 cells/uL (ref 0–200)
Basophils Relative: 0.7 %
Eosinophils Absolute: 167 cells/uL (ref 15–500)
Eosinophils Relative: 2.2 %
HCT: 45.2 % — ABNORMAL HIGH (ref 35.0–45.0)
Hemoglobin: 14.9 g/dL (ref 11.7–15.5)
Lymphs Abs: 2888 cells/uL (ref 850–3900)
MCH: 31.6 pg (ref 27.0–33.0)
MCHC: 33 g/dL (ref 32.0–36.0)
MCV: 96 fL (ref 80.0–100.0)
MPV: 10.3 fL (ref 7.5–12.5)
Monocytes Relative: 7.7 %
Neutro Abs: 3906 cells/uL (ref 1500–7800)
Neutrophils Relative %: 51.4 %
Platelets: 299 10*3/uL (ref 140–400)
RBC: 4.71 10*6/uL (ref 3.80–5.10)
RDW: 12.2 % (ref 11.0–15.0)
Total Lymphocyte: 38 %
WBC: 7.6 10*3/uL (ref 3.8–10.8)

## 2021-12-23 LAB — AMYLASE: Amylase: 24 U/L (ref 21–101)

## 2021-12-23 LAB — HIV ANTIBODY (ROUTINE TESTING W REFLEX): HIV 1&2 Ab, 4th Generation: NONREACTIVE

## 2021-12-23 MED ORDER — PANTOPRAZOLE SODIUM 40 MG PO TBEC: 40.0000 mg | DELAYED_RELEASE_TABLET | Freq: Every day | ORAL | 3 refills | Status: DC

## 2021-12-23 NOTE — Addendum Note (Signed)
Addended by: Monica Becton on: 12/23/2021 08:46 AM ? ? Modules accepted: Orders ? ?

## 2021-12-29 ENCOUNTER — Ambulatory Visit (INDEPENDENT_AMBULATORY_CARE_PROVIDER_SITE_OTHER): Payer: Self-pay

## 2021-12-29 DIAGNOSIS — R1010 Upper abdominal pain, unspecified: Secondary | ICD-10-CM

## 2021-12-29 MED ORDER — IOHEXOL 300 MG/ML  SOLN
100.0000 mL | Freq: Once | INTRAMUSCULAR | Status: AC | PRN
  Administered 2021-12-29: 100 mL via INTRAVENOUS

## 2022-01-04 ENCOUNTER — Telehealth: Payer: Self-pay

## 2022-01-04 NOTE — Telephone Encounter (Signed)
Patient called and left msg regarding the CT she had done and the prescription that was called in.  ? ?Attempted to call patient; LVM for a return call.  ?

## 2022-01-05 NOTE — Telephone Encounter (Signed)
Spoke with patient and answered multiple questions about her CT scan and referral to GI.  ?

## 2022-02-08 ENCOUNTER — Encounter: Payer: Self-pay | Admitting: Family Medicine

## 2022-02-12 ENCOUNTER — Encounter: Payer: Self-pay | Admitting: Sports Medicine

## 2022-03-02 ENCOUNTER — Telehealth: Payer: Self-pay

## 2022-03-02 NOTE — Telephone Encounter (Signed)
Received Epic notification that pt has not read MyChart message regarding results.     Question regarding POCT URINALYSIS DIP (CLINITEK)  From  Silverio Decamp, MD To  [name], [name] Sent and Delivered  02/13/2022  1:24 PM     Hi [name], it just means that the concentration was high, this can happen when we are somewhat dehydrated.  Hydrate aggressively, no need to recheck this.  In addition the GI doctor typically books out pretty far, however its been a couple of months, so please call [facility] in [place] to schedule the appointment.  They have the referral. ___________________________________________ [name]. [name], M.D., ABFM., CAQSM. Primary Care and Sports Medicine [facility]  Adjunct Professor of [employer]    Previous Messages  ----- Message -----       From:[name]       Sent:02/12/2022  1:13 AM EDT         EY:[id number] [name] Field, MD    Subject:Question regarding POCT URINALYSIS DIP (CLINITEK)   I visited my results and realized my spec grav ua is a little higher than normal.   What does this mean pertaining to me specifically? I read on it being a specific gravity test but do not want to make assumptions regarding anything else.   Further information on this would be greatly appreciated.  I have also received no follow up calls from a gastrointestinal doctor as mentioned after my scan. Should I reach out to them directly and mention the office recommending me to them?   Thank you.    Audit Risk manager Last Read On  [name] Not Read       Called pt and discussed above message.  Pt expressed understanding.  [name], CMA

## 2022-04-18 ENCOUNTER — Ambulatory Visit (INDEPENDENT_AMBULATORY_CARE_PROVIDER_SITE_OTHER): Payer: Self-pay | Admitting: Sports Medicine

## 2022-04-18 ENCOUNTER — Ambulatory Visit (INDEPENDENT_AMBULATORY_CARE_PROVIDER_SITE_OTHER): Payer: Self-pay

## 2022-04-18 DIAGNOSIS — R6884 Jaw pain: Secondary | ICD-10-CM

## 2022-04-18 DIAGNOSIS — R1010 Upper abdominal pain, unspecified: Secondary | ICD-10-CM

## 2022-04-18 NOTE — Assessment & Plan Note (Signed)
Please see prior notes, improved considerably with cutting back alcohol use and the addition of pantoprazole. She will continue pantoprazole only as needed, and understands that if she does use this chronically it would be reasonable to check magnesium levels with her routine labs. She can follow this up with a PCP. She does plan to establish care with Dr. Tamera Punt.

## 2022-04-18 NOTE — Progress Notes (Signed)
    Procedures performed today:    None.  Independent interpretation of notes and tests performed by another provider:   None.  Brief History, Exam, Impression, and Recommendations:    Jaw pain This is a very pleasant 31 year old female, she consumed a lot of wine last night, slipped and fell in the shower impacting the left side of her jaw. On further questioning she does have significant problems with alcohol, but plans to cut back herself. She has severe pain as well as a small laceration left lower mandible, with tenderness to palpation, no malocclusion, no palpable mechanical symptoms at the temporomandibular joint. Teeth are fine. She will get over-the-counter Aleve, I will get mandibular x-rays, she will avoid chewy foods such as bagels for the next couple of weeks. Return to see me 4 weeks as needed.  Acute upper abdominal pain Please see prior notes, improved considerably with cutting back alcohol use and the addition of pantoprazole. She will continue pantoprazole only as needed, and understands that if she does use this chronically it would be reasonable to check magnesium levels with her routine labs. She can follow this up with a PCP. She does plan to establish care with Dr. Tamera Punt.    ____________________________________________ Ihor Austin. Benjamin Stain, M.D., ABFM., CAQSM., AME. Primary Care and Sports Medicine [facility]  Adjunct Professor of Family Medicine  [employer] [facility] [employer] [employer]  Restaurant manager, fast food

## 2022-04-18 NOTE — Assessment & Plan Note (Signed)
This is a very pleasant 31 year old female, she consumed a lot of wine last night, slipped and fell in the shower impacting the left side of her jaw. On further questioning she does have significant problems with alcohol, but plans to cut back herself. She has severe pain as well as a small laceration left lower mandible, with tenderness to palpation, no malocclusion, no palpable mechanical symptoms at the temporomandibular joint. Teeth are fine. She will get over-the-counter Aleve, I will get mandibular x-rays, she will avoid chewy foods such as bagels for the next couple of weeks. Return to see me 4 weeks as needed.

## 2022-04-24 ENCOUNTER — Encounter: Payer: Self-pay | Admitting: Sports Medicine

## 2022-05-23 ENCOUNTER — Ambulatory Visit (INDEPENDENT_AMBULATORY_CARE_PROVIDER_SITE_OTHER): Payer: Self-pay | Admitting: Family Medicine

## 2022-05-23 ENCOUNTER — Encounter: Payer: Self-pay | Admitting: Family Medicine

## 2022-05-23 VITALS — BP 98/67 | HR 93 | Ht 64.0 in | Wt 195.0 lb

## 2022-05-23 DIAGNOSIS — R635 Abnormal weight gain: Secondary | ICD-10-CM | POA: Insufficient documentation

## 2022-05-23 DIAGNOSIS — K449 Diaphragmatic hernia without obstruction or gangrene: Secondary | ICD-10-CM

## 2022-05-23 DIAGNOSIS — F419 Anxiety disorder, unspecified: Secondary | ICD-10-CM

## 2022-05-23 DIAGNOSIS — Z8619 Personal history of other infectious and parasitic diseases: Secondary | ICD-10-CM

## 2022-05-23 MED ORDER — ACYCLOVIR 400 MG PO TABS: 400.0000 mg | ORAL_TABLET | Freq: Every day | ORAL | 0 refills | Status: DC

## 2022-05-23 MED ORDER — BUSPIRONE HCL 15 MG PO TABS: 15.0000 mg | ORAL_TABLET | Freq: Two times a day (BID) | ORAL | 0 refills | Status: DC | PRN

## 2022-05-23 NOTE — Progress Notes (Signed)
Established patient visit   Patient: [name]   DOB: 07/23/1991   31 y.o. Female  MRN: [id number] Visit Date: 05/23/2022  Today's healthcare provider: Charlton Amor, DO   Chief Complaint  Patient presents with   Establish Care    SUBJECTIVE    Chief Complaint  Patient presents with   Establish Care   HPI  Pt presents to establish care.   She is here for a refill on acyclovir.   She was also on xanax previously prescribed by Dr. Lyn Hollingshead. She was previously on sertraline but took herself off it on her own. She had some sexual dysfunction side effects. She describes herself as a anxious or worrisome person in general. She says some days she will cry randomly. She was using the x[place] for [place].   Diet: she does not eat fruit; does eat veggies, tries to watch food intake with portion control. She says her weight has been fluctuating. Does not exercise.   Review of Systems  Constitutional:  Negative for activity change, fatigue and fever.  Respiratory:  Negative for cough and shortness of breath.   Cardiovascular:  Negative for chest pain.  Gastrointestinal:  Negative for abdominal pain.  Genitourinary:  Negative for difficulty urinating.      Current Meds  Medication Sig   busPIRone (BUSPAR) 15 MG tablet Take 1 tablet (15 mg total) by mouth 2 (two) times daily as needed.   [DISCONTINUED] acyclovir (ZOVIRAX) 400 MG tablet Take 400 mg by mouth 5 (five) times daily.    OBJECTIVE    BP 98/67   Pulse 93   Ht 5\' 4"  (1.626 m)   Wt 195 lb (88.5 kg)   SpO2 99%   BMI 33.47 kg/m   Physical Exam Vitals and nursing note reviewed.  Constitutional:      General: She is not in acute distress.    Appearance: Normal appearance.  HENT:     Head: Normocephalic and atraumatic.     Right Ear: External ear normal.     Left Ear: External ear normal.     Nose: Nose normal.  Eyes:     Conjunctiva/sclera: Conjunctivae normal.  Cardiovascular:      Rate and Rhythm: Normal rate and regular rhythm.  Pulmonary:     Effort: Pulmonary effort is normal.     Breath sounds: Normal breath sounds.  Neurological:     General: No focal deficit present.     Mental Status: She is alert and oriented to person, place, and time.  Psychiatric:        Mood and Affect: Mood normal.        Behavior: Behavior normal.        Thought Content: Thought content normal.        Judgment: Judgment normal.       ASSESSMENT & PLAN    Problem List Items Addressed This Visit       Respiratory   Hiatal hernia    - Seen on previous imaging. Pt is no longer taking protonix  - not having hiatal hernia like symptoms. Have sent in a referral to GI so she can be evaluated for hiatal hernia and see if there was anything that needed to be done  - pt will wait until she gets insurance before going to specialist       Relevant Orders   Ambulatory referral to Gastroenterology     Other   Weight gain - Primary    -  pt has noted recent weight gain  - will check thyroid and A1C      Relevant Orders   TSH + free T4   HgB A1c   Anxiety    - discussed xanax use and how it is not something that I usually prescribe due to addictive problems and not helping underlying anxiety like symptoms - pt feels she doesn't need something long term at this time for daily control  - will try buspar and see if this improves her symptoms.       Relevant Medications   busPIRone (BUSPAR) 15 MG tablet   Other Relevant Orders   Ambulatory referral to Behavioral Health   Other Visit Diagnoses     Hx of cold sores       Relevant Medications   acyclovir (ZOVIRAX) 400 MG tablet       Return in about 6 months (around 11/23/2022).      Meds ordered this encounter  Medications   acyclovir (ZOVIRAX) 400 MG tablet    Sig: Take 1 tablet (400 mg total) by mouth 5 (five) times daily.    Dispense:  30 tablet    Refill:  0   busPIRone (BUSPAR) 15 MG tablet    Sig: Take 1 tablet  (15 mg total) by mouth 2 (two) times daily as needed.    Dispense:  30 tablet    Refill:  0    Orders Placed This Encounter  Procedures   TSH + free T4   HgB A1c   Ambulatory referral to Behavioral Health    Referral Priority:   Routine    Referral Type:   Psychiatric    Referral Reason:   Specialty Services Required    Requested Specialty:   Behavioral Health    Number of Visits Requested:   1   Ambulatory referral to Gastroenterology    Referral Priority:   Routine    Referral Type:   Consultation    Referral Reason:   Specialty Services Required    Referred to Provider:   [facility]    Number of Visits Requested:   1   Charlton Amor, DO  [facility] [phone] (phone) [phone] (fax)  [facility]

## 2022-05-23 NOTE — Assessment & Plan Note (Signed)
-   discussed xanax use and how it is not something that I usually prescribe due to addictive problems and not helping underlying anxiety like symptoms - pt feels she doesn't need something long term at this time for daily control  - will try buspar and see if this improves her symptoms.

## 2022-05-23 NOTE — Assessment & Plan Note (Signed)
-   Seen on previous imaging. Pt is no longer taking protonix  - not having hiatal hernia like symptoms. Have sent in a referral to GI so she can be evaluated for hiatal hernia and see if there was anything that needed to be done  - pt will wait until she gets insurance before going to specialist

## 2022-05-23 NOTE — Assessment & Plan Note (Signed)
-  pt has noted recent weight gain  - will check thyroid and A1C

## 2022-05-23 NOTE — Patient Instructions (Signed)
[employer] 

## 2022-05-24 LAB — HEMOGLOBIN A1C
Hgb A1c MFr Bld: 5 % of total Hgb (ref <5.7)
Mean Plasma Glucose: 97 mg/dL
eAG (mmol/L): 5.4 mmol/L

## 2022-05-24 LAB — TSH+FREE T4: TSH W/REFLEX TO FT4: 2.57 mIU/L

## 2022-05-25 ENCOUNTER — Encounter (INDEPENDENT_AMBULATORY_CARE_PROVIDER_SITE_OTHER): Payer: Self-pay | Admitting: Family Medicine

## 2022-05-25 DIAGNOSIS — B3731 Acute candidiasis of vulva and vagina: Secondary | ICD-10-CM

## 2022-05-25 MED ORDER — FLUCONAZOLE 150 MG PO TABS: ORAL_TABLET | ORAL | 1 refills | Status: DC

## 2022-05-25 MED ORDER — FLUCONAZOLE 150 MG PO TABS: 150.0000 mg | ORAL_TABLET | Freq: Once | ORAL | 0 refills | Status: DC

## 2022-05-25 NOTE — Telephone Encounter (Signed)
Please see the MyChart message reply(ies) for my assessment and plan.    This patient gave consent for this Medical Advice Message and is aware that it may result in a bill to [employer], as well as the possibility of receiving a bill for a co-payment or deductible. They are an established patient, but are not seeking medical advice exclusively about a problem treated during an in person or video visit in the last seven days. I did not recommend an in person or video visit within seven days of my reply.    I spent a total of 10 minutes cumulative time within 7 days through [employer].   Responded via [employer]. Pt described symptoms of yeast infection from previous antibiotic usage that I did not place pt on nor was she seen in our clinic when she had pna. Have sent in fluconazole and explained how pt should take this.   Charlton Amor, DO

## 2022-07-06 ENCOUNTER — Encounter (INDEPENDENT_AMBULATORY_CARE_PROVIDER_SITE_OTHER): Payer: Self-pay | Admitting: Family Medicine

## 2022-07-06 DIAGNOSIS — B379 Candidiasis, unspecified: Secondary | ICD-10-CM

## 2022-07-06 DIAGNOSIS — K649 Unspecified hemorrhoids: Secondary | ICD-10-CM

## 2022-07-06 MED ORDER — PREPARATION H 0.25-50 % EX GEL: 1.0000 g | Freq: Every day | CUTANEOUS | 0 refills | Status: DC | PRN

## 2022-07-06 NOTE — Telephone Encounter (Signed)
Please see the MyChart message reply(ies) for my assessment and plan.    This patient gave consent for this Medical Advice Message and is aware that it may result in a bill to [employer], as well as the possibility of receiving a bill for a co-payment or deductible. They are an established patient, but are not seeking medical advice exclusively about a problem treated during an in person or video visit in the last seven days. I did not recommend an in person or video visit within seven days of my reply.    I spent a total of 10 minutes cumulative time within 7 days through [employer].  Owens Loffler, DO

## 2022-07-17 ENCOUNTER — Ambulatory Visit: Payer: Self-pay | Admitting: Behavioral Health

## 2022-08-08 ENCOUNTER — Ambulatory Visit (INDEPENDENT_AMBULATORY_CARE_PROVIDER_SITE_OTHER): Payer: Self-pay | Admitting: Physician Assistant

## 2022-08-08 ENCOUNTER — Encounter: Payer: Self-pay | Admitting: Physician Assistant

## 2022-08-08 VITALS — BP 128/89 | HR 54 | Ht 64.0 in | Wt 190.0 lb

## 2022-08-08 DIAGNOSIS — Z7251 High risk heterosexual behavior: Secondary | ICD-10-CM

## 2022-08-08 DIAGNOSIS — N898 Other specified noninflammatory disorders of vagina: Secondary | ICD-10-CM

## 2022-08-08 LAB — POCT URINALYSIS DIP (CLINITEK)
Bilirubin, UA: NEGATIVE
Blood, UA: NEGATIVE
Glucose, UA: NEGATIVE mg/dL
Ketones, POC UA: NEGATIVE mg/dL
Leukocytes, UA: NEGATIVE
Nitrite, UA: NEGATIVE
POC PROTEIN,UA: NEGATIVE
Spec Grav, UA: >=1.03 — AB (ref 1.010–1.025)
Urobilinogen, UA: 0.2 E.U./dL
pH, UA: 5.5 (ref 5.0–8.0)

## 2022-08-08 NOTE — Progress Notes (Signed)
Acute Office Visit  Subjective:     Patient ID: [name], female    DOB: 1991/01/29, 31 y.o.   MRN: [id number]  Chief Complaint  Patient presents with   Vaginal Itching    HPI Patient is in today for vaginal itching intermittently for weeks. She has never had recurrent problems with this. She has had 2 yeast infections in the past 6 months. She had normal A1C and TSH. She has had unprotected sex with one person. Nexplanon for birth control. She does do a regular Aveeno female wash.   .. 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     Objective:    BP 128/89   Pulse (!) 54   Ht 5\' 4"  (1.626 m)   Wt 190 lb (86.2 kg)   SpO2 99%   BMI 32.61 kg/m  BP Readings from Last 3 Encounters:  08/08/22 128/89  05/23/22 98/67  12/22/21 136/82   Wt Readings from Last 3 Encounters:  08/08/22 190 lb (86.2 kg)  05/23/22 195 lb (88.5 kg)  12/22/21 194 lb 0.6 oz (88 kg)    .12/24/21 Results for orders placed or performed in visit on 08/08/22  POCT URINALYSIS DIP (CLINITEK)  Result Value Ref Range   Color, UA yellow yellow   Clarity, UA clear clear   Glucose, UA negative negative mg/dL   Bilirubin, UA negative negative   Ketones, POC UA negative negative mg/dL   Spec Grav, UA 08/10/22 (A) 1.010 - 1.025   Blood, UA negative negative   pH, UA 5.5 5.0 - 8.0    POC PROTEIN,UA negative negative, trace   Urobilinogen, UA 0.2 0.2 or 1.0 E.U./dL   Nitrite, UA Negative Negative   Leukocytes, UA Negative Negative     Physical Exam Exam conducted with a chaperone present.  Constitutional:      Appearance: Normal appearance.  HENT:     Head: Normocephalic.  Cardiovascular:     Rate and Rhythm: Normal rate and regular rhythm.  Pulmonary:     Effort: Pulmonary effort is normal.  Abdominal:     General: There is no distension.     Palpations: Abdomen is soft. There is no mass.     Tenderness: There is no abdominal tenderness. There is no right CVA tenderness, left CVA tenderness, guarding or rebound.  Genitourinary:    General: Normal vulva.     Vagina: No vaginal discharge.     Comments: Labia minora slightly erythematous bilaterally no lesions or discharge Neurological:     General: No focal deficit present.     Mental Status: She is alert.  Psychiatric:        Mood and Affect: Mood normal.          Assessment & Plan:  >=6.295Marland KitchenTroy was  seen today for vaginal itching.  Diagnoses and all orders for this visit:  Vaginal irritation -     POCT URINALYSIS DIP (CLINITEK) -     SureSwab Advanced Vaginitis Plus,TMA  Unprotected sex   No overt etiology of symptoms today UA negative for blood, leuks, nitrites, protein Will get sure swab for STI, yeast, BV testing Could be a BV problem Consider boric acid or vaginal probiotics Consider stopping the douche Make sure drinking plenty of water   Tandy Gaw, PA-C

## 2022-08-08 NOTE — Patient Instructions (Addendum)
Boric Acid vaginal suppositories Vaginal Probiotics Will call with sure swab results.

## 2022-08-09 LAB — SURESWAB® ADVANCED VAGINITIS PLUS,TMA
C. trachomatis RNA, TMA: NOT DETECTED
CANDIDA SPECIES: DETECTED — AB
Candida glabrata: NOT DETECTED
N. gonorrhoeae RNA, TMA: NOT DETECTED
SURESWAB(R) ADV BACTERIAL VAGINOSIS(BV),TMA: POSITIVE — AB
TRICHOMONAS VAGINALIS (TV),TMA: NOT DETECTED

## 2022-08-10 ENCOUNTER — Telehealth: Payer: Self-pay

## 2022-08-10 NOTE — Telephone Encounter (Signed)
Patient called.  States having strong heart  palpitations this morning , shortness of breath,  possible pre-syncope,  states symptoms are better but still having some tingling in left arm  and heart still feels  different than normal.  Spoke to Dr. Butch Penny recommends patient to go to ER and can take a Buspar this morning to see if this helps but recommended to go on to ER. Patient  informed and agrees.

## 2022-08-11 ENCOUNTER — Other Ambulatory Visit: Payer: Self-pay | Admitting: Physician Assistant

## 2022-08-11 MED ORDER — METRONIDAZOLE 500 MG PO TABS: 500.0000 mg | ORAL_TABLET | Freq: Two times a day (BID) | ORAL | 0 refills | Status: AC

## 2022-08-11 MED ORDER — FLUCONAZOLE 150 MG PO TABS: ORAL_TABLET | ORAL | 0 refills | Status: DC

## 2022-08-11 NOTE — Progress Notes (Signed)
Testing showed yeast and bacterial vaginosis. I will send oral metronidazole and diflucan to pharmacy.

## 2022-08-29 ENCOUNTER — Encounter: Payer: Self-pay | Admitting: Family Medicine

## 2022-08-29 DIAGNOSIS — B379 Candidiasis, unspecified: Secondary | ICD-10-CM

## 2022-08-30 MED ORDER — FLUCONAZOLE 150 MG PO TABS: 150.0000 mg | ORAL_TABLET | Freq: Once | ORAL | 0 refills | Status: AC

## 2022-08-30 NOTE — Telephone Encounter (Signed)
Pt sent mychart about yeast and oral thrush infection. Have reviewed photos and responded to patient. Will send in diflucan for patient for yeast infection and this should take care of oral thrush. Tongue did not look like thrush, honestly no white plaques seen on tongue.   Please see the MyChart message reply(ies) for my assessment and plan.    This patient gave consent for this Medical Advice Message and is aware that it may result in a bill to [employer], as well as the possibility of receiving a bill for a co-payment or deductible. They are an established patient, but are not seeking medical advice exclusively about a problem treated during an in person or video visit in the last seven days. I did not recommend an in person or video visit within seven days of my reply.    I spent a total of 10 minutes cumulative time within 7 days through [employer].  Charlton Amor, DO

## 2022-09-13 ENCOUNTER — Encounter: Payer: Self-pay | Admitting: Family Medicine

## 2022-09-13 ENCOUNTER — Ambulatory Visit (INDEPENDENT_AMBULATORY_CARE_PROVIDER_SITE_OTHER): Payer: PRIVATE HEALTH INSURANCE

## 2022-09-13 ENCOUNTER — Ambulatory Visit (INDEPENDENT_AMBULATORY_CARE_PROVIDER_SITE_OTHER): Payer: PRIVATE HEALTH INSURANCE | Admitting: Family Medicine

## 2022-09-13 VITALS — BP 121/84 | HR 57 | Ht 64.0 in | Wt 192.4 lb

## 2022-09-13 DIAGNOSIS — R053 Chronic cough: Secondary | ICD-10-CM

## 2022-09-13 NOTE — Progress Notes (Signed)
Acute Office Visit  Subjective:     Patient ID: [name], female    DOB: [birth year], 31 y.o.   MRN: [id number]  Chief Complaint  Patient presents with   Shortness of Breath    Cough/ chest congestion resolved after 2 w round of  doxy     Shortness of Breath Associated symptoms include wheezing. Pertinent negatives include no chest pain or fever.   Patient is in today for acute visit.  Pt reports she would like to make sure she doesn't have pneumonia. She was diagnosed with CAP on 11/27. Given Doxycyline at that time due to sulfa allergy. She reports she still feels lingering cough and malaise. She still has some wheezing all day long. She reports cough on and off. No sweats or chills. Still eating well. No ear ache, no sore throat, no rhinorrhea.  Pt is a smoker on and off since 31 y.o  but not every day smoker. Since 31 y.o she smokes about 1/2 ppd but not everyday.  Review of Systems  Constitutional:  Positive for malaise/fatigue. Negative for fever.  Respiratory:  Positive for cough, shortness of breath and wheezing.   Cardiovascular:  Negative for chest pain.  All other systems reviewed and are negative.       Objective:    There were no vitals taken for this visit.   Physical Exam Vitals and nursing note reviewed.  Constitutional:      Appearance: Normal appearance. She is normal weight.  HENT:     Head: Normocephalic and atraumatic.     Right Ear: Tympanic membrane, ear canal and external ear normal.     Left Ear: Tympanic membrane, ear canal and external ear normal.     Nose: Nose normal.     Mouth/Throat:     Mouth: Mucous membranes are moist.     Pharynx: Oropharynx is clear.  Eyes:     Extraocular Movements: Extraocular movements intact.     Conjunctiva/sclera: Conjunctivae normal.     Pupils: Pupils are equal, round, and reactive to light.  Cardiovascular:     Rate and Rhythm: Normal rate and regular rhythm.     Pulses: Normal pulses.      Heart sounds: Normal heart sounds.  Pulmonary:     Effort: No respiratory distress.     Breath sounds: Rhonchi present.  Abdominal:     General: Abdomen is flat. Bowel sounds are normal.  Skin:    General: Skin is warm.     Capillary Refill: Capillary refill takes less than 2 seconds.  Neurological:     General: No focal deficit present.     Mental Status: She is alert and oriented to person, place, and time. Mental status is at baseline.  Psychiatric:        Mood and Affect: Mood normal.        Behavior: Behavior normal.        Thought Content: Thought content normal.        Judgment: Judgment normal.    No results found for any visits on 09/13/22.      Assessment & Plan:   Problem List Items Addressed This Visit   None Visit Diagnoses     Chronic cough    -  Primary   Relevant Orders   DG Chest 2 View      Due to continued cough and rhonchi on exam. Will send for xrays. Follow up pending results.  No orders of the defined types were  placed in this encounter.   No follow-ups on file.  Suzan Slick, MD

## 2022-09-28 ENCOUNTER — Telehealth: Payer: PRIVATE HEALTH INSURANCE | Admitting: Urgent Care

## 2022-09-28 DIAGNOSIS — N760 Acute vaginitis: Secondary | ICD-10-CM

## 2022-09-28 MED ORDER — METRONIDAZOLE 500 MG PO TABS: 500.0000 mg | ORAL_TABLET | Freq: Two times a day (BID) | ORAL | 0 refills | Status: AC

## 2022-09-28 NOTE — Progress Notes (Signed)
E-Visit for Vaginal Symptoms  We are sorry that you are not feeling well. Here is how we plan to help! Based on what you shared with me it looks like you: May have a vaginosis due to bacteria  Vaginosis is an inflammation of the vagina that can result in discharge, itching and pain. The cause is usually a change in the normal balance of vaginal bacteria or an infection. Vaginosis can also result from reduced estrogen levels after menopause.  The most common causes of vaginosis are:   Bacterial vaginosis which results from an overgrowth of one on several organisms that are normally present in your vagina.   Yeast infections which are caused by a naturally occurring fungus called candida.   Vaginal atrophy (atrophic vaginosis) which results from the thinning of the vagina from reduced estrogen levels after menopause.   Trichomoniasis which is caused by a parasite and is commonly transmitted by sexual intercourse.  Factors that increase your risk of developing vaginosis include: Medications, such as antibiotics and steroids Uncontrolled diabetes Use of hygiene products such as bubble bath, vaginal spray or vaginal deodorant Douching Wearing damp or tight-fitting clothing Using an intrauterine device (IUD) for birth control Hormonal changes, such as those associated with pregnancy, birth control pills or menopause Sexual activity Having a sexually transmitted infection  Your treatment plan is Metronidazole or Flagyl 500mg  twice a day for 7 days.  I have electronically sent this prescription into the pharmacy that you have chosen.  It is possible that the clear discharge is secondary to your implant. Bacterial vaginosis is sometimes a recurrent condition that is treated when symptomatic. Boric acid can be used when symptoms start, but is often not too effective. Do not drink alcohol while on the medication called in today. Please be seen in office if your symptoms persist.  Be sure to take  all of the medication as directed. Stop taking any medication if you develop a rash, tongue swelling or shortness of breath. Mothers who are breast feeding should consider pumping and discarding their breast milk while on these antibiotics. However, there is no consensus that infant exposure at these doses would be harmful.  Remember that medication creams can weaken latex condoms. Marland Kitchen   HOME CARE:  Good hygiene may prevent some types of vaginosis from recurring and may relieve some symptoms:  Avoid baths, hot tubs and whirlpool spas. Rinse soap from your outer genital area after a shower, and dry the area well to prevent irritation. Don't use scented or harsh soaps, such as those with deodorant or antibacterial action. Avoid irritants. These include scented tampons and pads. Wipe from front to back after using the toilet. Doing so avoids spreading fecal bacteria to your vagina.  Other things that may help prevent vaginosis include:  Don't douche. Your vagina doesn't require cleansing other than normal bathing. Repetitive douching disrupts the normal organisms that reside in the vagina and can actually increase your risk of vaginal infection. Douching won't clear up a vaginal infection. Use a latex condom. Both female and female latex condoms may help you avoid infections spread by sexual contact. Wear cotton underwear. Also wear pantyhose with a cotton crotch. If you feel comfortable without it, skip wearing underwear to bed. Yeast thrives in [employer] Your symptoms should improve in the next day or two.  GET HELP RIGHT AWAY IF:  You have pain in your lower abdomen ( pelvic area or over your ovaries) You develop nausea or vomiting You develop a fever  Your discharge changes or worsens You have persistent pain with intercourse You develop shortness of breath, a rapid pulse, or you faint.  These symptoms could be signs of problems or infections that need to be evaluated by a medical  provider now.  MAKE SURE YOU   Understand these instructions. Will watch your condition. Will get help right away if you are not doing well or get worse.  Thank you for choosing an e-visit.  Your e-visit answers were reviewed by a board certified advanced clinical practitioner to complete your personal care plan. Depending upon the condition, your plan could have included both over the counter or prescription medications.  Please review your pharmacy choice. Make sure the pharmacy is open so you can pick up prescription now. If there is a problem, you may contact your provider through [employer] and have the prescription routed to another pharmacy.  Your safety is important to [place]. If you have drug allergies check your prescription carefully.   For the next 24 hours you can use MyChart to ask questions about today's visit, request a non-urgent call back, or ask for a work or school excuse. You will get an email in the next two days asking about your experience. I hope that your e-visit has been valuable and will speed your recovery.   I have spent 5 minutes in review of e-visit questionnaire, review and updating patient chart, medical decision making and response to patient.   [place], PA

## 2023-02-24 ENCOUNTER — Other Ambulatory Visit: Payer: Self-pay | Admitting: Sports Medicine

## 2023-02-24 DIAGNOSIS — R1010 Upper abdominal pain, unspecified: Secondary | ICD-10-CM

## 2023-03-01 ENCOUNTER — Ambulatory Visit (INDEPENDENT_AMBULATORY_CARE_PROVIDER_SITE_OTHER): Payer: PRIVATE HEALTH INSURANCE | Admitting: Family Medicine

## 2023-03-01 ENCOUNTER — Encounter: Payer: Self-pay | Admitting: Family Medicine

## 2023-03-01 VITALS — BP 132/85 | HR 74 | Ht 64.0 in | Wt 195.0 lb

## 2023-03-01 DIAGNOSIS — F411 Generalized anxiety disorder: Secondary | ICD-10-CM | POA: Diagnosis not present

## 2023-03-01 DIAGNOSIS — B354 Tinea corporis: Secondary | ICD-10-CM | POA: Diagnosis not present

## 2023-03-01 DIAGNOSIS — K219 Gastro-esophageal reflux disease without esophagitis: Secondary | ICD-10-CM | POA: Diagnosis not present

## 2023-03-01 DIAGNOSIS — F321 Major depressive disorder, single episode, moderate: Secondary | ICD-10-CM | POA: Insufficient documentation

## 2023-03-01 DIAGNOSIS — Z6833 Body mass index (BMI) 33.0-33.9, adult: Secondary | ICD-10-CM

## 2023-03-01 DIAGNOSIS — E6609 Other obesity due to excess calories: Secondary | ICD-10-CM | POA: Insufficient documentation

## 2023-03-01 MED ORDER — VORTIOXETINE HBR 5 MG PO TABS: 5.0000 mg | ORAL_TABLET | Freq: Every day | ORAL | 3 refills | Status: DC

## 2023-03-01 MED ORDER — FLUCONAZOLE 150 MG PO TABS: 150.0000 mg | ORAL_TABLET | ORAL | 0 refills | Status: DC

## 2023-03-01 MED ORDER — PANTOPRAZOLE SODIUM 40 MG PO TBEC: 40.0000 mg | DELAYED_RELEASE_TABLET | Freq: Every day | ORAL | 3 refills | Status: DC

## 2023-03-01 MED ORDER — CLOTRIMAZOLE 1 % EX CREA: 1.0000 | TOPICAL_CREAM | Freq: Two times a day (BID) | CUTANEOUS | 0 refills | Status: DC

## 2023-03-01 NOTE — Assessment & Plan Note (Signed)
-   fluorescein light negative  - will go ahead and give clotrimazole cream. Pt is worried and wanted oral pill to ensure it was gone-given fluconazole once weekly for four weeks - recommended washing bedding and clothes

## 2023-03-01 NOTE — Assessment & Plan Note (Signed)
-   had a long discussion about weight.  - ref to nutrition - encouraged pt to diet and exercise

## 2023-03-01 NOTE — Assessment & Plan Note (Signed)
-   pt was on zoloft in the past and possibly other ssris per her memory and said they gave her sexual side effects which caused her to stop these  - pt notices she is in need of something to help with sadness and worry

## 2023-03-01 NOTE — Progress Notes (Signed)
Established patient visit   Patient: [name]   DOB: 12-17-1990   32 y.o. Female  MRN: [id number] Visit Date: 03/01/2023  Today's healthcare provider: Charlton Amor, DO   Chief Complaint  Patient presents with   Rash    SUBJECTIVE    Chief Complaint  Patient presents with   Rash   HPI  Pt presents for acute visit of rash on abdomen. Denies any new soaps. Says her cat has been scratching.   Review of Systems  Constitutional:  Negative for activity change, fatigue and fever.  Respiratory:  Negative for cough and shortness of breath.   Cardiovascular:  Negative for chest pain.  Gastrointestinal:  Negative for abdominal pain.  Genitourinary:  Negative for difficulty urinating.  Skin:  Positive for rash.       Current Meds  Medication Sig   clotrimazole (LOTRIMIN) 1 % cream Apply 1 Application topically 2 (two) times daily.   fluconazole (DIFLUCAN) 150 MG tablet Take 1 tablet (150 mg total) by mouth once a week.   pantoprazole (PROTONIX) 40 MG tablet Take 1 tablet (40 mg total) by mouth daily.   vortioxetine HBr (TRINTELLIX) 5 MG TABS tablet Take 1 tablet (5 mg total) by mouth daily.    OBJECTIVE    BP 132/85   Pulse 74   Ht 5\' 4"  (1.626 m)   Wt 195 lb (88.5 kg)   SpO2 99%   BMI 33.47 kg/m   Physical Exam Vitals and nursing note reviewed.  Constitutional:      General: She is not in acute distress.    Appearance: Normal appearance.  HENT:     Head: Normocephalic and atraumatic.     Right Ear: External ear normal.     Left Ear: External ear normal.     Nose: Nose normal.  Eyes:     Conjunctiva/sclera: Conjunctivae normal.  Cardiovascular:     Rate and Rhythm: Normal rate and regular rhythm.  Pulmonary:     Effort: Pulmonary effort is normal.     Breath sounds: Normal breath sounds.  Skin:    Findings: Rash present.     Comments: Rash on abdomen with redness   Neurological:     General: No focal deficit present.     Mental Status: She is  alert and oriented to person, place, and time.  Psychiatric:        Mood and Affect: Mood normal.        Behavior: Behavior normal.        Thought Content: Thought content normal.        Judgment: Judgment normal.        ASSESSMENT & PLAN    Problem List Items Addressed This Visit       Digestive   Gastroesophageal reflux disease without esophagitis    - pt needs refill on pantoprazole      Relevant Medications   pantoprazole (PROTONIX) 40 MG tablet     Musculoskeletal and Integument   Tinea corporis - Primary    - fluorescein light negative  - will go ahead and give clotrimazole cream. Pt is worried and wanted oral pill to ensure it was gone-given fluconazole once weekly for four weeks - recommended washing bedding and clothes        Relevant Medications   fluconazole (DIFLUCAN) 150 MG tablet   clotrimazole (LOTRIMIN) 1 % cream     Other   Generalized anxiety disorder   Relevant Medications   vortioxetine  HBr (TRINTELLIX) 5 MG TABS tablet   Depression, major, single episode, moderate (HCC)    - pt was on zoloft in the past and possibly other ssris per her memory and said they gave her sexual side effects which caused her to stop these  - pt notices she is in need of something to help with sadness and worry      Relevant Medications   vortioxetine HBr (TRINTELLIX) 5 MG TABS tablet   Class 1 obesity due to excess calories with serious comorbidity and body mass index (BMI) of 33.0 to 33.9 in adult    - had a long discussion about weight.  - ref to nutrition - encouraged pt to diet and exercise      Relevant Orders   Amb ref to Medical Nutrition Therapy-MNT    Return in about 4 weeks (around 03/29/2023).      Meds ordered this encounter  Medications   fluconazole (DIFLUCAN) 150 MG tablet    Sig: Take 1 tablet (150 mg total) by mouth once a week.    Dispense:  4 tablet    Refill:  0   clotrimazole (LOTRIMIN) 1 % cream    Sig: Apply 1 Application topically 2  (two) times daily.    Dispense:  30 g    Refill:  0   pantoprazole (PROTONIX) 40 MG tablet    Sig: Take 1 tablet (40 mg total) by mouth daily.    Dispense:  30 tablet    Refill:  3   vortioxetine HBr (TRINTELLIX) 5 MG TABS tablet    Sig: Take 1 tablet (5 mg total) by mouth daily.    Dispense:  30 tablet    Refill:  3    Orders Placed This Encounter  Procedures   Amb ref to Medical Nutrition Therapy-MNT    Referral Priority:   Routine    Referral Type:   Consultation    Referral Reason:   Specialty Services Required    Requested Specialty:   Nutrition    Number of Visits Requested:   1     Charlton Amor, DO  [facility] Primary [facility] Sports Medicine at [facility] [phone] (phone) [phone] (fax)  [facility]

## 2023-03-01 NOTE — Assessment & Plan Note (Signed)
-   pt needs refill on pantoprazole

## 2023-03-20 ENCOUNTER — Telehealth: Payer: Self-pay

## 2023-03-20 NOTE — Telephone Encounter (Signed)
.  Initiated Prior authorization ZOX:[id number] (formerly Brintellix) 5MG  tablets Via: Covermymeds Case/Key:[id number] Status: Pending as of 03/20/23 Reason: Notified Pt via: Mychart    The following medications are covered by pt insurance Citalopram Not Required Duloxetine Not Required Escitalopram Solution Not Required Fluoxetine Not Required Paroxetine Not Required Sertraline Not Required Venlafaxine ER Not Required Vilazodone Not Required

## 2023-06-26 ENCOUNTER — Encounter: Payer: Self-pay | Admitting: Family Medicine

## 2023-06-26 ENCOUNTER — Telehealth (INDEPENDENT_AMBULATORY_CARE_PROVIDER_SITE_OTHER): Payer: PRIVATE HEALTH INSURANCE | Admitting: Family Medicine

## 2023-06-26 VITALS — Ht 64.0 in | Wt 195.0 lb

## 2023-06-26 DIAGNOSIS — R051 Acute cough: Secondary | ICD-10-CM

## 2023-06-27 ENCOUNTER — Other Ambulatory Visit: Payer: Self-pay | Admitting: Family Medicine

## 2023-06-27 DIAGNOSIS — Z8619 Personal history of other infectious and parasitic diseases: Secondary | ICD-10-CM

## 2023-07-11 ENCOUNTER — Other Ambulatory Visit: Payer: Self-pay | Admitting: Family Medicine

## 2023-07-11 DIAGNOSIS — F419 Anxiety disorder, unspecified: Secondary | ICD-10-CM

## 2023-08-14 ENCOUNTER — Ambulatory Visit (INDEPENDENT_AMBULATORY_CARE_PROVIDER_SITE_OTHER): Payer: PRIVATE HEALTH INSURANCE | Admitting: Family Medicine

## 2023-08-14 VITALS — BP 119/77 | HR 69 | Ht 64.0 in | Wt 199.0 lb

## 2023-08-14 DIAGNOSIS — Z113 Encounter for screening for infections with a predominantly sexual mode of transmission: Secondary | ICD-10-CM | POA: Diagnosis not present

## 2023-08-14 DIAGNOSIS — Z0000 Encounter for general adult medical examination without abnormal findings: Secondary | ICD-10-CM | POA: Diagnosis not present

## 2023-08-14 DIAGNOSIS — R234 Changes in skin texture: Secondary | ICD-10-CM | POA: Diagnosis not present

## 2023-08-14 NOTE — Patient Instructions (Signed)
Nexplanon removal is every three years

## 2023-08-14 NOTE — Progress Notes (Signed)
Established patient visit   Patient: [name]   DOB: [birth year]   32 y.o. Female  MRN: [id number] Visit Date: 08/14/2023  Today's healthcare provider: Charlton Amor, DO   Chief Complaint  Patient presents with   Annual Exam    SUBJECTIVE    Chief Complaint  Patient presents with   Annual Exam   HPI   Pt presents for wellness visit today. Pt doing well.   Diet and exercise: fair but can use improvement   Cancers: In grandparents  Dad-passed from cirrhosis  Mother- has anxiety   Review of Systems  Constitutional:  Negative for activity change, fatigue and fever.  Respiratory:  Negative for cough and shortness of breath.   Cardiovascular:  Negative for chest pain.  Gastrointestinal:  Negative for abdominal pain.  Genitourinary:  Negative for difficulty urinating.       Current Meds  Medication Sig   acyclovir (ZOVIRAX) 400 MG tablet TAKE 1 TABLET (400 MG TOTAL) BY MOUTH 5 (FIVE) TIMES DAILY.   busPIRone (BUSPAR) 15 MG tablet TAKE 1 TABLET BY MOUTH TWICE A DAY AS NEEDED   pantoprazole (PROTONIX) 40 MG tablet Take 1 tablet (40 mg total) by mouth daily.    OBJECTIVE    BP 119/77 (BP Location: Left Arm, Patient Position: Sitting, Cuff Size: Large)   Pulse 69   Ht 5\' 4"  (1.626 m)   Wt 199 lb (90.3 kg)   SpO2 100%   BMI 34.16 kg/m   Physical Exam Vitals and nursing note reviewed.  Constitutional:      General: She is not in acute distress.    Appearance: Normal appearance.  HENT:     Head: Normocephalic and atraumatic.     Right Ear: External ear normal.     Left Ear: External ear normal.     Nose: Nose normal.  Eyes:     Conjunctiva/sclera: Conjunctivae normal.  Cardiovascular:     Rate and Rhythm: Normal rate and regular rhythm.  Pulmonary:     Effort: Pulmonary effort is normal.     Breath sounds: Normal breath sounds.     Comments: Breast exam done with chaperone in room. No erythema, nipple changes appreciated  Abdominal:      General: Abdomen is flat. Bowel sounds are normal.     Palpations: Abdomen is soft.     Tenderness: There is no abdominal tenderness.  Neurological:     General: No focal deficit present.     Mental Status: She is alert and oriented to person, place, and time.  Psychiatric:        Mood and Affect: Mood normal.        Behavior: Behavior normal.        Thought Content: Thought content normal.        Judgment: Judgment normal.        ASSESSMENT & PLAN    Problem List Items Addressed This Visit       Other   Routine adult health maintenance - Primary   Relevant Orders   Basic Metabolic Panel (BMET)   CBC   Lipid panel   Breast skin changes    Pt said she noticed right breast skin changes. On exam I do not appreciate any changes in coloration or erythema. No lumps or lymphadenopathy appreciated. Pt does not have an obgyn and I recommend we get her with one       Relevant Orders   Ambulatory referral to Obstetrics / Gynecology  Other Visit Diagnoses     Screen for STD (sexually transmitted disease)       Relevant Orders   RPR   HIV antibody (with reflex)   Chlamydia/Gonococcus/Trichomonas, NAA       No follow-ups on file.      No orders of the defined types were placed in this encounter.   Orders Placed This Encounter  Procedures   Chlamydia/Gonococcus/Trichomonas, NAA   Basic Metabolic Panel (BMET)   CBC   Lipid panel   RPR   HIV antibody (with reflex)   Ambulatory referral to Obstetrics / Gynecology    Referral Priority:   Routine    Referral Type:   Consultation    Referral Reason:   Specialty Services Required    Requested Specialty:   Obstetrics and Gynecology     Charlton Amor, DO  [facility] Primary Care & Sports Medicine at [facility] [phone] (phone) [phone] (fax)  [facility]

## 2023-08-14 NOTE — Assessment & Plan Note (Addendum)
Pt said she noticed right breast skin changes. On exam I do not appreciate any changes in coloration or erythema. No lumps or lymphadenopathy appreciated. Pt does not have an obgyn and I recommend we get her with one

## 2023-08-15 ENCOUNTER — Encounter: Payer: Self-pay | Admitting: Family Medicine

## 2023-08-15 LAB — BASIC METABOLIC PANEL
BUN/Creatinine Ratio: 21 (ref 9–23)
BUN: 11 mg/dL (ref 6–20)
CO2: 20 mmol/L (ref 20–29)
Calcium: 9.5 mg/dL (ref 8.7–10.2)
Chloride: 106 mmol/L (ref 96–106)
Creatinine, Ser: 0.52 mg/dL — ABNORMAL LOW (ref 0.57–1.00)
Glucose: 91 mg/dL (ref 70–99)
Potassium: 4.6 mmol/L (ref 3.5–5.2)
Sodium: 140 mmol/L (ref 134–144)
eGFR: 125 mL/min/{1.73_m2} (ref >59)

## 2023-08-15 LAB — CBC
Hematocrit: 44.2 % (ref 34.0–46.6)
Hemoglobin: 14.4 g/dL (ref 11.1–15.9)
MCH: 31.6 pg (ref 26.6–33.0)
MCHC: 32.6 g/dL (ref 31.5–35.7)
MCV: 97 fL (ref 79–97)
Platelets: 266 10*3/uL (ref 150–450)
RBC: 4.55 x10E6/uL (ref 3.77–5.28)
RDW: 12.3 % (ref 11.7–15.4)
WBC: 7.4 10*3/uL (ref 3.4–10.8)

## 2023-08-15 LAB — LIPID PANEL
Chol/HDL Ratio: 4.4 ratio (ref 0.0–4.4)
Cholesterol, Total: 199 mg/dL (ref 100–199)
HDL: 45 mg/dL (ref >39)
LDL Chol Calc (NIH): 139 mg/dL — ABNORMAL HIGH (ref 0–99)
Triglycerides: 84 mg/dL (ref 0–149)
VLDL Cholesterol Cal: 15 mg/dL (ref 5–40)

## 2023-08-15 LAB — RPR: RPR Ser Ql: NONREACTIVE

## 2023-08-15 LAB — HIV ANTIBODY (ROUTINE TESTING W REFLEX): HIV Screen 4th Generation wRfx: NONREACTIVE

## 2023-08-16 LAB — CHLAMYDIA/GONOCOCCUS/TRICHOMONAS, NAA
Chlamydia by NAA: NEGATIVE
Gonococcus by NAA: NEGATIVE
Trich vag by NAA: NEGATIVE

## 2023-08-28 LAB — AST: AST: 16 [IU]/L (ref 0–40)

## 2023-08-28 LAB — SPECIMEN STATUS REPORT

## 2023-08-28 LAB — ALT: ALT: 27 [IU]/L (ref 0–32)

## 2023-08-29 ENCOUNTER — Other Ambulatory Visit: Payer: Self-pay | Admitting: Family Medicine

## 2023-08-29 DIAGNOSIS — F419 Anxiety disorder, unspecified: Secondary | ICD-10-CM

## 2023-10-14 ENCOUNTER — Ambulatory Visit
Admission: EM | Admit: 2023-10-14 | Discharge: 2023-10-14 | Disposition: A | Discharge status: Home / Self Care | Payer: PRIVATE HEALTH INSURANCE | Attending: Family Medicine | Admitting: Family Medicine

## 2023-10-14 DIAGNOSIS — J111 Influenza due to unidentified influenza virus with other respiratory manifestations: Secondary | ICD-10-CM | POA: Diagnosis not present

## 2023-10-14 LAB — POC SARS CORONAVIRUS 2 AG -  ED: SARS Coronavirus 2 Ag: NEGATIVE

## 2023-10-14 LAB — POCT INFLUENZA A/B
Influenza A, POC: NEGATIVE
Influenza B, POC: NEGATIVE

## 2023-10-14 MED ORDER — PROMETHAZINE-DM 6.25-15 MG/5ML PO SYRP: 5.0000 mL | ORAL_SOLUTION | Freq: Four times a day (QID) | ORAL | 0 refills | Status: DC | PRN

## 2023-10-14 MED ORDER — IBUPROFEN 800 MG PO TABS
800.0000 mg | ORAL_TABLET | Freq: Once | ORAL | Status: AC
Start: 2023-10-14 — End: 2023-10-14
  Administered 2023-10-14: 800 mg via ORAL

## 2023-10-14 NOTE — ED Provider Notes (Signed)
[facility]    CSN: [id number] Arrival date & time: 10/14/23  1033      History   Chief Complaint Chief Complaint  Patient presents with   Fever   Cough    HPI [name] is a 33 y.o. female.   HPI  Patient is here for flulike symptoms.  She has had a cough for 4 days, chest congestion.  She has had some fever body aches and headaches.  She has been taking Mucinex over-the-counter.  She took her last dose of acetaminophen at 4 AM.  She presents with a fever of 102.3.  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   Implanon in place 12/14/2015   06/2015      Patient Active Problem List   Diagnosis Date Noted   Routine adult health maintenance 08/14/2023   Breast skin changes 08/14/2023   Depression, major, single episode, moderate (HCC) 03/01/2023   Gastroesophageal reflux disease without esophagitis 03/01/2023   Tinea corporis 03/01/2023   Class 1 obesity due to excess calories with serious comorbidity and body mass index (BMI) of 33.0 to 33.9 in adult 03/01/2023   Vaginal irritation 08/08/2022   Weight gain 05/23/2022   Hiatal hernia 05/23/2022   Anxiety 05/23/2022   Jaw pain 04/18/2022   Acute upper abdominal pain 12/22/2021   Needlestick injury due to hypodermic needle 12/22/2021   Implanon in place 12/14/2015   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      Home Medications    Prior to Admission medications   Medication Sig Start Date End Date Taking? Authorizing Provider  promethazine-dextromethorphan (PROMETHAZINE-DM) 6.25-15 MG/5ML syrup Take 5 mLs by mouth 4 (four) times daily as needed for cough. 10/14/23  Yes Eustace Moore, MD  busPIRone (BUSPAR) 15 MG tablet TAKE 1 TABLET BY MOUTH TWICE A DAY AS NEEDED 08/30/23   Charlton Amor, DO  etonogestrel (NEXPLANON) 68 MG IMPL implant 1 each (68 mg total) by Subdermal route once for 1 dose. Inserted 10/05/20 w/ Dr Lyn Hollingshead at [facility] 10/05/20 08/08/22  Sunnie Nielsen, DO  pantoprazole (PROTONIX) 40 MG tablet Take 1 tablet (40 mg total) by mouth daily. 03/01/23   Charlton Amor, DO    Family History Family History  Problem Relation Age of Onset   Depression Mother    Alcohol abuse Father    Hypertension Maternal Uncle    Depression Maternal Grandmother    Cancer Maternal Grandfather    Cancer Paternal Grandmother     Social History Social History   Tobacco Use   Smoking status: Every Day    Current packs/day: 0.50    Average packs/day: 0.5 packs/day for 10.0 years (5.0 ttl pk-yrs)    Types: Cigarettes   Smokeless tobacco: Never  Vaping Use   Vaping status:  Never Used  Substance Use Topics   Alcohol use: Yes    Alcohol/week: 9.0 standard drinks of alcohol    Types: 9 Glasses of wine per week   Drug use: Yes    Frequency: 1.0 times per week    Types: Marijuana     Allergies   Sulfa antibiotics   Review of Systems Review of Systems See HPI  Physical Exam Triage Vital Signs ED Triage Vitals  Encounter Vitals Group     BP 10/14/23 1048 (!) 136/95     Systolic BP Percentile --      Diastolic BP Percentile --      Pulse Rate 10/14/23 1048 85     Resp 10/14/23 1048 16     Temp 10/14/23 1048 (!) 102.3 F (39.1 C)     Temp Source 10/14/23 1048 Oral     SpO2 10/14/23 1048 96 %     Weight 10/14/23 1046 190 lb (86.2 kg)      Height --      Head Circumference --      Peak Flow --      Pain Score 10/14/23 1045 0     Pain Loc --      Pain Education --      Exclude from Growth Chart --    No data found.  Updated Vital Signs BP (!) 136/95 (BP Location: Left Arm)   Pulse 85   Temp (!) 102.3 F (39.1 C) (Oral)   Resp 16   Wt 86.2 kg   LMP 10/13/2023 (Exact Date)   SpO2 96%   BMI 32.61 kg/m   Physical Exam Constitutional:      General: She is not in acute distress.    Appearance: She is well-developed. She is ill-appearing.  HENT:     Head: Normocephalic and atraumatic.     Right Ear: Tympanic membrane normal.     Left Ear: Tympanic membrane normal.     Nose: Nose normal. No congestion.     Mouth/Throat:     Pharynx: No posterior oropharyngeal erythema.  Eyes:     Conjunctiva/sclera: Conjunctivae normal.     Pupils: Pupils are equal, round, and reactive to light.  Cardiovascular:     Rate and Rhythm: Normal rate.     Heart sounds: Normal heart sounds.  Pulmonary:     Effort: Pulmonary effort is normal. No respiratory distress.     Breath sounds: Rhonchi present.  Abdominal:     General: There is no distension.     Palpations: Abdomen is soft.  Musculoskeletal:        General: Normal range of motion.     Cervical back: Normal range of motion.  Lymphadenopathy:     Cervical: No cervical adenopathy.  Skin:    General: Skin is warm and dry.  Neurological:     Mental Status: She is alert.      UC Treatments / Results  Labs (all labs ordered are listed, but only abnormal results are displayed) Labs Reviewed  POCT INFLUENZA A/B - Normal  POC SARS CORONAVIRUS 2 AG -  ED    EKG   Radiology No results found.  Procedures Procedures (including critical care time)  Medications Ordered in UC Medications  ibuprofen (ADVIL) tablet 800 mg (800 mg Oral Given 10/14/23 1105)    Initial Impression / Assessment and Plan / UC Course  I have reviewed the triage vital signs and the nursing  notes.  Pertinent labs & imaging results that  were available during my care of the patient were reviewed by me and considered in my medical decision making (see chart for details).     Explained the patient is a viral illness and how to treat at home Final Clinical Impressions(s) / UC Diagnoses   Final diagnoses:  Influenza-like illness     Discharge Instructions      Home to rest When at home take 1000 mg of acetaminophen (Tylenol) Continue drink lots of fluids If given you prescription cough medicine if is needed Call for problems   ED Prescriptions     Medication Sig Dispense Auth. Provider   promethazine-dextromethorphan (PROMETHAZINE-DM) 6.25-15 MG/5ML syrup Take 5 mLs by mouth 4 (four) times daily as needed for cough. 118 mL Eustace Moore, MD      PDMP not reviewed this encounter.   Eustace Moore, MD 10/14/23 [phone]

## 2023-10-14 NOTE — ED Triage Notes (Signed)
Patient presents to UC for cough x 4 days, chest congestion yesterday. Fever today. Treating symptoms with mucinex. Last dose at 0400. States the mucinex has tylenol.

## 2023-10-14 NOTE — Discharge Instructions (Signed)
Home to rest When at home take 1000 mg of acetaminophen (Tylenol) Continue drink lots of fluids If given you prescription cough medicine if is needed Call for problems

## 2023-10-16 ENCOUNTER — Ambulatory Visit: Payer: PRIVATE HEALTH INSURANCE | Admitting: Medical-Surgical

## 2023-10-23 ENCOUNTER — Encounter: Payer: Self-pay | Admitting: Medical-Surgical

## 2023-10-23 ENCOUNTER — Ambulatory Visit (INDEPENDENT_AMBULATORY_CARE_PROVIDER_SITE_OTHER): Payer: PRIVATE HEALTH INSURANCE | Admitting: Medical-Surgical

## 2023-10-23 VITALS — BP 131/79 | HR 64 | Resp 20 | Ht 64.0 in | Wt 200.0 lb

## 2023-10-23 DIAGNOSIS — Z3046 Encounter for surveillance of implantable subdermal contraceptive: Secondary | ICD-10-CM | POA: Diagnosis not present

## 2023-10-23 DIAGNOSIS — Z30017 Encounter for initial prescription of implantable subdermal contraceptive: Secondary | ICD-10-CM

## 2023-10-23 MED ORDER — ETONOGESTREL 68 MG ~~LOC~~ IMPL
68.0000 mg | DRUG_IMPLANT | Freq: Once | SUBCUTANEOUS | Status: AC
  Administered 2023-10-23: 68 mg via SUBCUTANEOUS

## 2023-10-23 NOTE — Progress Notes (Signed)
        Established patient visit  History, exam, impression, and plan:  1. Encounter for removal and reinsertion of Nexplanon (Primary) [name] 33 year old female presenting today for removal and reinsertion of a Nexplanon.  Reports that she has been using this since she was a teenager and is happy with the method of contraception.  Does not have any children and does not currently desire pregnancy.  No weight gain or skin changes with use of Nexplanon.  Does have light menstrual cycles that are irregular but this is manageable.  All questions answered regarding the procedure at the time of the appointment.  See below for procedure note. - etonogestrel (NEXPLANON) implant 68 mg   Procedures performed this visit: Nexplanon Removal and Reinsertion Procedure Note PRE-OP DIAGNOSIS: Nexplanon, desire for continuation of same contraception  POST-OP DIAGNOSIS: Same  PROCEDURE: Nexplanon Removal and reinsertion Performing Provider: Christen Butter, FNP  Risks and benefits reviewed with the patient. Informed consent obtained, patient opts to proceed today.   PROCEDURE:  Anesthesia: 1% Lidocaine with epinephrine 5 ml  Procedure: Consent obtained. A time-out was performed prior to initiating procedure to be sure of right patient and right location. The area surrounding the Nexplanon was prepared in the usual sterile manner. The site was anesthetized with lidocaine. A skin incision was made over the distal aspect of the device. The capsule lysed sharply and the device removed using a hemostat. Nexplanon  trocar was inserted subcutaneously and then Nexplanon  capsule delivered subcutaneously Trocar was removed from the insertion site. Nexplanon  capsule was palpated by provider and patient to assure satisfactory placement. Estimated blood loss <1 mL Dressings applied: Steri-Strip x1 and small pressure bandage Followup: The patient tolerated the procedure well without complications.  Standard  post-procedure care is explained and return precautions are given.  Followup: The patient tolerated the procedure well without complications. Standard post-procedure care is explained and return precautions are given. Contraception is advised until conception is desired.  Return if symptoms worsen or fail to improve.  __________________________________ Thayer Ohm, DNP, APRN, FNP-BC Primary Care and Sports Medicine [facility]

## 2023-10-30 NOTE — Progress Notes (Signed)
error 

## 2023-11-20 ENCOUNTER — Other Ambulatory Visit: Payer: Self-pay | Admitting: Family Medicine

## 2023-11-20 DIAGNOSIS — K219 Gastro-esophageal reflux disease without esophagitis: Secondary | ICD-10-CM

## 2023-11-22 ENCOUNTER — Other Ambulatory Visit: Payer: Self-pay | Admitting: Family Medicine

## 2023-11-22 DIAGNOSIS — K219 Gastro-esophageal reflux disease without esophagitis: Secondary | ICD-10-CM

## 2023-11-22 NOTE — Telephone Encounter (Signed)
 Last Fill: 03/01/23  Last OV: 08/14/23 Next OV: None Scheduled  Routing to provider for review/authorization.

## 2023-11-22 NOTE — Telephone Encounter (Signed)
 Copied from CRM [id number]. Topic: Clinical - Medication Refill >> Nov 22, 2023 11:27 AM Dennison Nancy wrote: Most Recent Primary Care Visit:  Provider: Christen Butter  Department: [facility]  Visit Type: PROCEDURE  Date: 10/23/2023  Medication: pantoprazole (PROTONIX) 40 MG tablet  Has the patient contacted their pharmacy? Yes (Agent: If no, request that the patient contact the pharmacy for the refill. If patient does not wish to contact the pharmacy document the reason why and proceed with request.) (Agent: If yes, when and what did the pharmacy advise?)  Is this the correct pharmacy for this prescription? Yes If no, delete pharmacy and type the correct one.  This is the patient's preferred pharmacy:  [id number] [place], [place] - [address] [place] [address] Phone: [phone] Fax: [phone]   Has the prescription been filled recently? No  Is the patient out of the medication? No  Has the patient been seen for an appointment in the last year OR does the patient have an upcoming appointment? Yes  Can we respond through MyChart? Yes  Agent: Please be advised that Rx refills may take up to 3 business days. We ask that you follow-up with your pharmacy.

## 2023-11-23 MED ORDER — PANTOPRAZOLE SODIUM 40 MG PO TBEC: 40.0000 mg | DELAYED_RELEASE_TABLET | Freq: Every day | ORAL | 3 refills | Status: DC

## 2023-12-19 ENCOUNTER — Other Ambulatory Visit: Payer: Self-pay | Admitting: Family Medicine

## 2023-12-19 ENCOUNTER — Encounter: Payer: Self-pay | Admitting: Family Medicine

## 2023-12-19 DIAGNOSIS — Z8619 Personal history of other infectious and parasitic diseases: Secondary | ICD-10-CM

## 2023-12-19 NOTE — Telephone Encounter (Signed)
 Copied from CRM [id number]. Topic: Clinical - Prescription Issue >> Dec 19, 2023  1:06 PM Marland Kitchen D wrote: Patient says her pharmacy says the prescription she get's for cold sores was denied she would like to know why and if she can get this refilled. The medication is not listed.  Preferred Pharmacy-  [id number] [place], [place] - [phone] [place] [address] Phone: [phone] Fax: [phone] Hours: Not open 24 hours

## 2023-12-20 MED ORDER — ACYCLOVIR 400 MG PO TABS: 400.0000 mg | ORAL_TABLET | ORAL | 1 refills | Status: AC | PRN

## 2023-12-31 ENCOUNTER — Ambulatory Visit: Payer: PRIVATE HEALTH INSURANCE | Admitting: Physician Assistant

## 2024-01-07 ENCOUNTER — Ambulatory Visit (INDEPENDENT_AMBULATORY_CARE_PROVIDER_SITE_OTHER): Payer: PRIVATE HEALTH INSURANCE | Admitting: Physician Assistant

## 2024-01-07 ENCOUNTER — Ambulatory Visit (INDEPENDENT_AMBULATORY_CARE_PROVIDER_SITE_OTHER): Payer: PRIVATE HEALTH INSURANCE

## 2024-01-07 VITALS — BP 111/70 | HR 54 | Ht 64.0 in | Wt 201.0 lb

## 2024-01-07 DIAGNOSIS — E66811 Obesity, class 1: Secondary | ICD-10-CM

## 2024-01-07 DIAGNOSIS — R0602 Shortness of breath: Secondary | ICD-10-CM

## 2024-01-07 DIAGNOSIS — F17200 Nicotine dependence, unspecified, uncomplicated: Secondary | ICD-10-CM

## 2024-01-07 DIAGNOSIS — R051 Acute cough: Secondary | ICD-10-CM

## 2024-01-07 DIAGNOSIS — F411 Generalized anxiety disorder: Secondary | ICD-10-CM

## 2024-01-07 DIAGNOSIS — F419 Anxiety disorder, unspecified: Secondary | ICD-10-CM

## 2024-01-07 DIAGNOSIS — I4510 Unspecified right bundle-branch block: Secondary | ICD-10-CM

## 2024-01-07 DIAGNOSIS — E6609 Other obesity due to excess calories: Secondary | ICD-10-CM

## 2024-01-07 DIAGNOSIS — Z6834 Body mass index (BMI) 34.0-34.9, adult: Secondary | ICD-10-CM

## 2024-01-07 NOTE — Progress Notes (Unsigned)
   Acute Office Visit  Subjective:     Patient ID: Ismael Treptow, female    DOB: [birth year], 33 y.o.   MRN: [id number]  Chief Complaint  Patient presents with  . Shortness of Breath    Mood anxiety     HPI Patient is in today for ***  ROS      Objective:    BP 111/70   Pulse (!) 54   Ht 5\' 4"  (1.626 m)   Wt 201 lb (91.2 kg)   SpO2 98%   BMI 34.50 kg/m  {Vitals History (Optional):23777}  Physical Exam  No results found for any visits on 01/07/24.      Assessment & Plan:   Problem List Items Addressed This Visit   None Visit Diagnoses       SOB (shortness of breath)    -  Primary   Relevant Orders   EKG 12-Lead       No orders of the defined types were placed in this encounter.   No follow-ups on file.  Quindell Shere, PA-C

## 2024-01-07 NOTE — Patient Instructions (Addendum)
 Ashwaganda 300mg  twice a day Will make referral for cardiology Chest xray downstairs today

## 2024-01-08 ENCOUNTER — Encounter: Payer: Self-pay | Admitting: Physician Assistant

## 2024-01-08 DIAGNOSIS — R0602 Shortness of breath: Secondary | ICD-10-CM | POA: Insufficient documentation

## 2024-01-08 DIAGNOSIS — I4510 Unspecified right bundle-branch block: Secondary | ICD-10-CM | POA: Insufficient documentation

## 2024-01-08 DIAGNOSIS — E6609 Other obesity due to excess calories: Secondary | ICD-10-CM | POA: Insufficient documentation

## 2024-01-08 DIAGNOSIS — F419 Anxiety disorder, unspecified: Secondary | ICD-10-CM | POA: Insufficient documentation

## 2024-01-08 DIAGNOSIS — E66811 Obesity, class 1: Secondary | ICD-10-CM | POA: Insufficient documentation

## 2024-01-08 DIAGNOSIS — R051 Acute cough: Secondary | ICD-10-CM | POA: Insufficient documentation

## 2024-01-08 DIAGNOSIS — F17200 Nicotine dependence, unspecified, uncomplicated: Secondary | ICD-10-CM | POA: Insufficient documentation

## 2024-01-08 NOTE — Progress Notes (Signed)
 Chest xray looks great. No acute findings.

## 2024-01-20 ENCOUNTER — Other Ambulatory Visit: Payer: Self-pay | Admitting: Family Medicine

## 2024-01-20 DIAGNOSIS — F419 Anxiety disorder, unspecified: Secondary | ICD-10-CM

## 2024-01-23 ENCOUNTER — Other Ambulatory Visit: Payer: Self-pay | Admitting: Family Medicine

## 2024-01-23 NOTE — Telephone Encounter (Signed)
 Copied from CRM [id number]. Topic: Clinical - Medication Refill >> Jan 23, 2024  5:01 PM Corin V wrote: Most Recent Primary Care Visit:  Provider: Araceli Knight  Department: [facility]  Visit Type: OFFICE VISIT  Date: 01/07/2024  Medication: busPIRone  (BUSPAR ) 15 MG tablet  Has the patient contacted their pharmacy? Yes (Agent: If no, request that the patient contact the pharmacy for the refill. If patient does not wish to contact the pharmacy document the reason why and proceed with request.) (Agent: If yes, when and what did the pharmacy advise?)  Is this the correct pharmacy for this prescription? Yes If no, delete pharmacy and type the correct one.  This is the patient's preferred pharmacy:  [id number] [place], [place] - [address] [place] [address] Phone: [phone] Fax: [phone]   Has the prescription been filled recently? No  Is the patient out of the medication? Yes  Has the patient been seen for an appointment in the last year OR does the patient have an upcoming appointment? Yes  Can we respond through MyChart? Yes  Agent: Please be advised that Rx refills may take up to 3 business days. We ask that you follow-up with your pharmacy.

## 2024-01-25 ENCOUNTER — Telehealth: Payer: Self-pay

## 2024-01-25 MED ORDER — BUSPIRONE HCL 15 MG PO TABS: 15.0000 mg | ORAL_TABLET | Freq: Two times a day (BID) | ORAL | 1 refills | Status: AC | PRN

## 2024-01-25 NOTE — Telephone Encounter (Signed)
 Spoke with patient. States that there was a misunderstanding . She had meant that she did not take sertraline  any longer and this was taken on a daily basis that she did not like. She does take the Buspar  as needed and  it does help her anxiety . She is requesting a refill of the Buspar .

## 2024-01-25 NOTE — Telephone Encounter (Signed)
 Copied from CRM [id number]. Topic: Clinical - Prescription Issue >> Jan 24, 2024  5:28 PM [name] wrote: Reason for CRM: [facility] shows [facility]one  has been discontinued, she requested a refill of this medication on 04/30 and wanted to check the current status. busPIRone  (BUSPAR ) 15 MG tablet is not currently listed on patient's med list. However, she is completely out of this and would like this sent to her pharmacy as soon as possible.

## 2024-01-25 NOTE — Telephone Encounter (Signed)
 Forwarding message to Cherre Cornish, NP covering Dr. Dianah Fort Buspar  not showing in patient current med list. Last OV 10/23/2023 by Cherre Cornish, NP  Upcoming appt = none

## 2024-01-25 NOTE — Telephone Encounter (Signed)
 Refill sent.

## 2024-01-31 ENCOUNTER — Encounter: Payer: Self-pay | Admitting: Family Medicine

## 2024-05-27 ENCOUNTER — Encounter: Payer: Self-pay | Admitting: Sports Medicine

## 2024-07-03 ENCOUNTER — Other Ambulatory Visit: Payer: Self-pay | Admitting: Medical-Surgical

## 2024-07-03 ENCOUNTER — Telehealth: Payer: Self-pay | Admitting: Medical-Surgical

## 2024-07-03 DIAGNOSIS — K219 Gastro-esophageal reflux disease without esophagitis: Secondary | ICD-10-CM

## 2024-07-03 NOTE — Telephone Encounter (Unsigned)
 Copied from CRM [id number]. Topic: Clinical - Medication Refill >> Jul 03, 2024  4:06 PM Selinda RAMAN wrote: Medication: pantoprazole  (PROTONIX ) 40 MG tablet  Has the patient contacted their pharmacy? Yes   This is the patient's preferred pharmacy:  [id number] [place], [place] - [address] [place] [address] Phone: [phone] Fax: [phone]  Is this the correct pharmacy for this prescription? Yes If no, delete pharmacy and type the correct one.   Has the prescription been filled recently? No  Is the patient out of the medication? No she only has a few pills left  Has the patient been seen for an appointment in the last year OR does the patient have an upcoming appointment? Yes  Can we respond through MyChart? Yes  Please assist patient further

## 2024-07-03 NOTE — Telephone Encounter (Signed)
 Copied from CRM [id number]. Topic: Appointments - Scheduling Inquiry for Clinic >> Jul 03, 2024  4:15 PM [name] wrote: Reason for CRM: The patient called in to schedule her yearly physical and it has been scheduled with provider [name] at her request as her previous provider has left the practice. She wants to make sure this is ok and if it needs to be rescheduled to please call her. Also she sent in a refill request and sent it to [name] as she didn't know who else to send it to . She just wanted to give the heads up. Please assist patient further.  Have you approved this patient to see you?

## 2024-07-04 MED ORDER — PANTOPRAZOLE SODIUM 40 MG PO TBEC: 40.0000 mg | DELAYED_RELEASE_TABLET | Freq: Every day | ORAL | 1 refills | Status: DC

## 2024-07-04 NOTE — Telephone Encounter (Signed)
 Patient asked about prescription to be refilled on pantoprazole  (PROTONIX ) 40 MG tablet send to   [id number] - [place], [place] - [address] [place] [address] Phone: [phone] Fax: [phone]

## 2024-07-04 NOTE — Telephone Encounter (Signed)
 Protonix  has been sent to the pharmacy as requested.  ___________________________________________ Zada FREDRIK Palin, DNP, APRN, FNP-BC Primary Care and Sports Medicine [facility]

## 2024-08-15 ENCOUNTER — Encounter: Payer: PRIVATE HEALTH INSURANCE | Admitting: Medical-Surgical

## 2024-08-15 DIAGNOSIS — Z0000 Encounter for general adult medical examination without abnormal findings: Secondary | ICD-10-CM

## 2024-10-10 ENCOUNTER — Other Ambulatory Visit: Payer: Self-pay | Admitting: Medical-Surgical

## 2024-10-10 DIAGNOSIS — K219 Gastro-esophageal reflux disease without esophagitis: Secondary | ICD-10-CM

## 2024-10-16 NOTE — Progress Notes (Unsigned)
 "  Complete physical exam  Patient: [name]   DOB: [birth year]   33 y.o. Female  MRN: [id number]  Subjective:    Chief Complaint  Patient presents with   Annual Exam    [name] is a 34 y.o. female who presents today for a complete physical exam. She reports consuming a general diet. The patient does not participate in regular exercise at present. She generally feels well. She reports sleeping well. She does have additional problems to discuss today.    Most recent fall risk assessment:    10/17/2024    3:07 PM  Fall Risk   Falls in the past year? 0  Number falls in past yr: 0  Injury with Fall? 0  Risk for fall due to : No Fall Risks  Follow up Falls evaluation completed     Most recent depression screenings:    10/17/2024    3:07 PM 01/08/2024    5:15 PM  PHQ 2/9 Scores  PHQ - 2 Score 2 3  PHQ- 9 Score 8 12      Data saved with a previous flowsheet row definition    Vision:Not within last year  and Dental: No current dental problems and Receives regular dental care    Patient Care Team: Willo Mini, NP as PCP - General (Nurse Practitioner)   Show/hide medication list[1]  Review of Systems  Constitutional:  Negative for chills, fever, malaise/fatigue and weight loss.  HENT:  Positive for congestion. Negative for ear pain, hearing loss, sinus pain and sore throat.   Eyes:  Negative for blurred vision, photophobia and pain.  Respiratory:  Positive for cough and wheezing. Negative for sputum production and shortness of breath.   Cardiovascular:  Negative for chest pain, palpitations and leg swelling.  Gastrointestinal:  Negative for abdominal pain, constipation, diarrhea, heartburn, nausea and vomiting.  Genitourinary:  Negative for dysuria, frequency and urgency.  Musculoskeletal:  Negative for falls and neck pain.  Skin:  Negative for itching and rash.  Neurological:  Negative for dizziness, weakness and headaches.  Endo/Heme/Allergies:  Negative  for polydipsia. Does not bruise/bleed easily.  Psychiatric/Behavioral:  Positive for depression. Negative for substance abuse and suicidal ideas. The patient is nervous/anxious.      Objective:    BP 136/88   Pulse 63   Temp 98.8 F (37.1 C) (Oral)   Resp 20   Ht 5' 4 (1.626 m)   Wt 204 lb 1.9 oz (92.6 kg)   SpO2 97%   BMI 35.04 kg/m    Physical Exam Vitals reviewed.  Constitutional:      General: She is not in acute distress.    Appearance: Normal appearance. She is obese. She is not ill-appearing.  HENT:     Head: Normocephalic and atraumatic.     Right Ear: Tympanic membrane, ear canal and external ear normal. There is no impacted cerumen.     Left Ear: Tympanic membrane, ear canal and external ear normal. There is no impacted cerumen.     Nose: Nose normal. No congestion or rhinorrhea.     Mouth/Throat:     Mouth: Mucous membranes are moist.     Pharynx: No oropharyngeal exudate or posterior oropharyngeal erythema.  Eyes:     General: No scleral icterus.       Right eye: No discharge.        Left eye: No discharge.     Extraocular Movements: Extraocular movements intact.     Conjunctiva/sclera: Conjunctivae normal.  Pupils: Pupils are equal, round, and reactive to light.  Neck:     Thyroid: No thyromegaly.     Vascular: No carotid bruit or JVD.     Trachea: Trachea normal.  Cardiovascular:     Rate and Rhythm: Normal rate and regular rhythm.     Pulses: Normal pulses.     Heart sounds: Normal heart sounds. No murmur heard.    No friction rub. No gallop.  Pulmonary:     Effort: Pulmonary effort is normal. No respiratory distress.     Breath sounds: Normal breath sounds. No wheezing.  Abdominal:     General: Bowel sounds are normal. There is no distension.     Palpations: Abdomen is soft.     Tenderness: There is no abdominal tenderness. There is no guarding.  Musculoskeletal:        General: Normal range of motion.     Cervical back: Normal range of  motion and neck supple.  Lymphadenopathy:     Cervical: No cervical adenopathy.  Skin:    General: Skin is warm and dry.  Neurological:     Mental Status: She is alert and oriented to person, place, and time.     Cranial Nerves: No cranial nerve deficit.  Psychiatric:        Mood and Affect: Mood normal.        Behavior: Behavior normal.        Thought Content: Thought content normal.        Judgment: Judgment normal.      No results found for any visits on 10/17/24.     Assessment & Plan:    Routine Health Maintenance and Physical Exam  Immunization History  Administered Date(s) Administered   PFIZER(Purple Top)SARS-COV-2 Vaccination 01/02/2020, 04/25/2020   Tdap 07/25/2017    Health Maintenance  Topic Date Due   Hepatitis B Vaccines 19-59 Average Risk (1 of 3 - 19+ 3-dose series) Never done   COVID-19 Vaccine (3 - 2025-26 season) 11/02/2024 (Originally 05/26/2024)   Influenza Vaccine  12/23/2024 (Originally 04/25/2024)   Pneumococcal Vaccine (1 of 2 - PCV) 01/06/2025 (Originally 05/15/2008)   Cervical Cancer Screening (HPV/Pap Cotest)  10/05/2025   DTaP/Tdap/Td (2 - Td or Tdap) 07/26/2027   HPV VACCINES (No Doses Required) Completed   Hepatitis C Screening  Completed   HIV Screening  Completed   Meningococcal B Vaccine  Aged Out    Discussed health benefits of physical activity, and encouraged her to engage in regular exercise appropriate for her age and condition.  1. Annual physical exam (Primary) Checking labs as below. UTD on preventative care. Wellness information provided with AVS. - Lipid panel - CBC with Differential/Platelet - CMP14+EGFR  2. Gastroesophageal reflux disease without esophagitis Continue protonix  40mg  daily but recommend using daily prn rather than a scheduled medication.  - pantoprazole  (PROTONIX ) 40 MG tablet; Take 1 tablet (40 mg total) by mouth daily.  Dispense: 30 tablet; Refill: 1  3. Diabetes mellitus screening Checking A1c.  -  Hemoglobin A1c  4. Thyroid disorder screen Checking TSH.  - TSH  5. Depression, major, single episode, moderate (HCC) 6. Anxiety Referring to behavioral health for counseling.  - Ambulatory referral to Behavioral Health  7. Acute bronchitis, unspecified organism Getting chest x-ray today. Treating with Medrol dosepack, Albuterol inhaler, and Tessalon  perls.  - DG Chest 2 View; Future  8. Class 2 obesity due to excess calories without serious comorbidity with body mass index (BMI) of 35.0 to 35.9 in adult  -  Medications used to help with weight loss provided with AVS.  - Discussed referral to Bariatrics.  - Discussed referral to [facility].  - She will let me know if anything interests her.   Return in about 1 year (around 10/17/2025) for annual physical exam or sooner if needed.   Zada Palin, NP      [1]  Outpatient Medications Prior to Visit  Medication Sig   acyclovir  (ZOVIRAX ) 400 MG tablet Take 1 tablet (400 mg total) by mouth as needed (see below). TAKE 1 TABLET BY MOUTH 5 TIMES DAILY FOR 5 DAYS START AT 1ST SIGN OF COLD SORE   busPIRone  (BUSPAR ) 15 MG tablet Take 1 tablet (15 mg total) by mouth 2 (two) times daily as needed.   [DISCONTINUED] pantoprazole  (PROTONIX ) 40 MG tablet Take 1 tablet (40 mg total) by mouth daily.   No facility-administered medications prior to visit.   "

## 2024-10-17 ENCOUNTER — Ambulatory Visit: Payer: PRIVATE HEALTH INSURANCE | Admitting: Medical-Surgical

## 2024-10-17 ENCOUNTER — Encounter: Payer: Self-pay | Admitting: Medical-Surgical

## 2024-10-17 VITALS — BP 136/88 | HR 63 | Temp 98.8°F | Resp 20 | Ht 64.0 in | Wt 204.1 lb

## 2024-10-17 DIAGNOSIS — E66812 Obesity, class 2: Secondary | ICD-10-CM | POA: Diagnosis not present

## 2024-10-17 DIAGNOSIS — J209 Acute bronchitis, unspecified: Secondary | ICD-10-CM | POA: Diagnosis not present

## 2024-10-17 DIAGNOSIS — F419 Anxiety disorder, unspecified: Secondary | ICD-10-CM | POA: Diagnosis not present

## 2024-10-17 DIAGNOSIS — E6609 Other obesity due to excess calories: Secondary | ICD-10-CM | POA: Diagnosis not present

## 2024-10-17 DIAGNOSIS — Z6835 Body mass index (BMI) 35.0-35.9, adult: Secondary | ICD-10-CM

## 2024-10-17 DIAGNOSIS — Z0000 Encounter for general adult medical examination without abnormal findings: Secondary | ICD-10-CM

## 2024-10-17 DIAGNOSIS — K219 Gastro-esophageal reflux disease without esophagitis: Secondary | ICD-10-CM | POA: Diagnosis not present

## 2024-10-17 DIAGNOSIS — Z131 Encounter for screening for diabetes mellitus: Secondary | ICD-10-CM

## 2024-10-17 DIAGNOSIS — F321 Major depressive disorder, single episode, moderate: Secondary | ICD-10-CM | POA: Diagnosis not present

## 2024-10-17 DIAGNOSIS — Z1329 Encounter for screening for other suspected endocrine disorder: Secondary | ICD-10-CM

## 2024-10-17 MED ORDER — ALBUTEROL SULFATE HFA 108 (90 BASE) MCG/ACT IN AERS: 2.0000 | INHALATION_SPRAY | Freq: Four times a day (QID) | RESPIRATORY_TRACT | 0 refills | Status: AC | PRN

## 2024-10-17 MED ORDER — METHYLPREDNISOLONE 4 MG PO TBPK: ORAL_TABLET | ORAL | 0 refills | Status: AC

## 2024-10-17 MED ORDER — PANTOPRAZOLE SODIUM 40 MG PO TBEC: 40.0000 mg | DELAYED_RELEASE_TABLET | Freq: Every day | ORAL | 1 refills | Status: AC

## 2024-10-17 MED ORDER — BENZONATATE 200 MG PO CAPS: 200.0000 mg | ORAL_CAPSULE | Freq: Three times a day (TID) | ORAL | 0 refills | Status: AC | PRN

## 2024-10-17 NOTE — Patient Instructions (Signed)
 Medications to assist with weight loss Please review the following information to prepare for your next conversation with your provider about weight loss.  The following medications can be used if your BMI is:  Above 30kg/m2 At or above 27 kg/m2 and you have other weight-related health problems such as diabetes or high blood pressure Medications for weight loss work best when combined with a healthy eating plan and exercise. We stop or change medications if you have not lost 5% of your total body weight by 3 months (this is a sign the medication is not working). Many patients find they need medication indefinitely to maintain their weight loss. When you start a medication, expect monthly visits for the first 3 months and then visits every 3-6 months depending on your response to the medication. These visits are usually in person so we can check your weight, blood pressure, and heart rate. Average weight loss for the medications below is the average percentage change with the medication. For example, if the medication leads to an average of a 10% weight loss, if you weighed 200 pounds, you would expect to lose 20 pounds.  Medication How it works Common side effects Approximate cost if insurance does not cover  Phentermine Pill Decreases appetite Dry mouth, high blood pressure, increased heart rate, insomnia, constipation, headache, anxiety, jitteriness. Less than $30 per month  Phentermine/ Topiramate Pill Decreases appetite and increases sense of fullness Numbness or tingling in hands and feet, dizziness, altered taste, insomnia, constipation and dry mouth.  If prescribed as two separate generic drugs, around $30 per month   If prescribed as a brand combination pill $150-250 per month  Naltrexone/ bupropion Pill  Decreases appetite and food cravings  Nausea, vomiting, dry mouth, constipation, insomnia, anxiety, dizziness, headache.  In the first 12 weeks, may increase heart rate and blood pressure.  If prescribed as two separate generic drugs around $30 per month If prescribed as a brand combination pill over $500 per month  Semaglutide [facility])  Weekly injection/pill Slows down digestion, decreases food intake, helps you feel full Nausea, vomiting, diarrhea, constipation, mild increase in heart rate, headache, low blood sugar, irritation at the injection site.  Over $599per month  Tirzepatide                (Zepbound) Weekly injection Decreases appetite and makes you feel full Stomach pain, nausea, constipation, diarrhea, vomiting, irritation at the injection site.  Over $599.    Metformin Pill Decreases appetite Nausea, bloating, cramping, diarrhea Less than $30 per month (covered by insurance)

## 2024-10-18 LAB — CMP14+EGFR
ALT: 50 [IU]/L — ABNORMAL HIGH (ref 0–32)
AST: 21 [IU]/L (ref 0–40)
Albumin: 4 g/dL (ref 3.9–4.9)
Alkaline Phosphatase: 95 [IU]/L (ref 41–116)
BUN/Creatinine Ratio: 17 (ref 9–23)
BUN: 9 mg/dL (ref 6–20)
Bilirubin Total: <0.2 mg/dL (ref 0.0–1.2)
CO2: 20 mmol/L (ref 20–29)
Calcium: 9.5 mg/dL (ref 8.7–10.2)
Chloride: 106 mmol/L (ref 96–106)
Creatinine, Ser: 0.52 mg/dL — ABNORMAL LOW (ref 0.57–1.00)
Globulin, Total: 2.2 g/dL (ref 1.5–4.5)
Glucose: 84 mg/dL (ref 70–99)
Potassium: 4.2 mmol/L (ref 3.5–5.2)
Sodium: 139 mmol/L (ref 134–144)
Total Protein: 6.2 g/dL (ref 6.0–8.5)
eGFR: 124 mL/min/{1.73_m2}

## 2024-10-18 LAB — CBC WITH DIFFERENTIAL/PLATELET
Basophils Absolute: 0 10*3/uL (ref 0.0–0.2)
Basos: 1 %
EOS (ABSOLUTE): 0.2 10*3/uL (ref 0.0–0.4)
Eos: 3 %
Hematocrit: 43 % (ref 34.0–46.6)
Hemoglobin: 14.3 g/dL (ref 11.1–15.9)
Immature Grans (Abs): 0 10*3/uL (ref 0.0–0.1)
Immature Granulocytes: 0 %
Lymphocytes Absolute: 3 10*3/uL (ref 0.7–3.1)
Lymphs: 49 %
MCH: 31.5 pg (ref 26.6–33.0)
MCHC: 33.3 g/dL (ref 31.5–35.7)
MCV: 95 fL (ref 79–97)
Monocytes Absolute: 0.5 10*3/uL (ref 0.1–0.9)
Monocytes: 9 %
Neutrophils Absolute: 2.3 10*3/uL (ref 1.4–7.0)
Neutrophils: 38 %
Platelets: 292 10*3/uL (ref 150–450)
RBC: 4.54 x10E6/uL (ref 3.77–5.28)
RDW: 12.3 % (ref 11.7–15.4)
WBC: 6 10*3/uL (ref 3.4–10.8)

## 2024-10-18 LAB — LIPID PANEL
Chol/HDL Ratio: 5.5 ratio — ABNORMAL HIGH (ref 0.0–4.4)
Cholesterol, Total: 183 mg/dL (ref 100–199)
HDL: 33 mg/dL — ABNORMAL LOW
LDL Chol Calc (NIH): 102 mg/dL — ABNORMAL HIGH (ref 0–99)
Triglycerides: 284 mg/dL — ABNORMAL HIGH (ref 0–149)
VLDL Cholesterol Cal: 48 mg/dL — ABNORMAL HIGH (ref 5–40)

## 2024-10-18 LAB — HEMOGLOBIN A1C
Est. average glucose Bld gHb Est-mCnc: 103 mg/dL
Hgb A1c MFr Bld: 5.2 % (ref 4.8–5.6)

## 2024-10-18 LAB — TSH: TSH: 1.5 u[IU]/mL (ref 0.450–4.500)

## 2024-10-19 ENCOUNTER — Ambulatory Visit: Payer: Self-pay | Admitting: Medical-Surgical

## 2024-10-20 NOTE — Addendum Note (Signed)
 Addended byBETHA WILLO MINI on: 10/20/2024 01:25 PM   Modules accepted: Orders

## 2024-10-23 MED ORDER — WEGOVY 4 MG PO TABS: 4.0000 mg | ORAL_TABLET | Freq: Every day | ORAL | 0 refills | Status: AC

## 2025-02-09 ENCOUNTER — Ambulatory Visit: Admitting: Cardiology
